# Patient Record
Sex: Female | Born: 1960 | Race: White | Hispanic: No | Smoking: Former smoker
Health system: Southern US, Community
[De-identification: ages and names within clinical notes are randomized; demographics above are authoritative.]

## PROBLEM LIST (undated history)

## (undated) ENCOUNTER — Ambulatory Visit (HOSPITAL_COMMUNITY): Admission: EM | Discharge status: Against Medical Advice | Payer: 59

## (undated) DIAGNOSIS — K8020 Calculus of gallbladder without cholecystitis without obstruction: Secondary | ICD-10-CM

## (undated) DIAGNOSIS — C541 Malignant neoplasm of endometrium: Secondary | ICD-10-CM

## (undated) DIAGNOSIS — N200 Calculus of kidney: Secondary | ICD-10-CM

## (undated) DIAGNOSIS — R569 Unspecified convulsions: Secondary | ICD-10-CM

## (undated) DIAGNOSIS — T8859XA Other complications of anesthesia, initial encounter: Secondary | ICD-10-CM

## (undated) DIAGNOSIS — T4145XA Adverse effect of unspecified anesthetic, initial encounter: Secondary | ICD-10-CM

## (undated) DIAGNOSIS — M5030 Other cervical disc degeneration, unspecified cervical region: Secondary | ICD-10-CM

## (undated) DIAGNOSIS — N83209 Unspecified ovarian cyst, unspecified side: Secondary | ICD-10-CM

## (undated) DIAGNOSIS — R011 Cardiac murmur, unspecified: Secondary | ICD-10-CM

## (undated) DIAGNOSIS — K769 Liver disease, unspecified: Secondary | ICD-10-CM

## (undated) DIAGNOSIS — K5790 Diverticulosis of intestine, part unspecified, without perforation or abscess without bleeding: Secondary | ICD-10-CM

## (undated) DIAGNOSIS — T7840XA Allergy, unspecified, initial encounter: Secondary | ICD-10-CM

## (undated) DIAGNOSIS — K621 Rectal polyp: Secondary | ICD-10-CM

## (undated) HISTORY — DX: Liver disease, unspecified: K76.9

## (undated) HISTORY — PX: CHOLECYSTECTOMY: SHX55

## (undated) HISTORY — DX: Calculus of kidney: N20.0

## (undated) HISTORY — DX: Malignant neoplasm of endometrium: C54.1

## (undated) HISTORY — DX: Calculus of gallbladder without cholecystitis without obstruction: K80.20

## (undated) HISTORY — DX: Allergy, unspecified, initial encounter: T78.40XA

## (undated) HISTORY — DX: Rectal polyp: K62.1

## (undated) HISTORY — PX: CERVICAL FUSION: SHX112

## (undated) HISTORY — PX: OVARIAN CYST REMOVAL: SHX89

## (undated) HISTORY — DX: Diverticulosis of intestine, part unspecified, without perforation or abscess without bleeding: K57.90

## (undated) HISTORY — PX: OTHER SURGICAL HISTORY: SHX169

---

## 1998-03-18 ENCOUNTER — Emergency Department (HOSPITAL_COMMUNITY): Admission: EM | Admit: 1998-03-18 | Discharge: 1998-03-18 | Payer: Self-pay | Admitting: Emergency Medicine

## 1998-03-18 ENCOUNTER — Encounter: Payer: Self-pay | Admitting: Emergency Medicine

## 1998-03-25 ENCOUNTER — Other Ambulatory Visit: Admission: RE | Admit: 1998-03-25 | Discharge: 1998-03-25 | Payer: Self-pay | Admitting: *Deleted

## 1999-06-02 ENCOUNTER — Ambulatory Visit (HOSPITAL_COMMUNITY): Admission: RE | Admit: 1999-06-02 | Discharge: 1999-06-02 | Payer: Self-pay | Admitting: Family Medicine

## 1999-08-20 ENCOUNTER — Ambulatory Visit (HOSPITAL_COMMUNITY): Admission: RE | Admit: 1999-08-20 | Discharge: 1999-08-20 | Payer: Self-pay | Admitting: *Deleted

## 1999-11-15 ENCOUNTER — Emergency Department (HOSPITAL_COMMUNITY): Admission: EM | Admit: 1999-11-15 | Discharge: 1999-11-15 | Payer: Self-pay | Admitting: Emergency Medicine

## 1999-11-15 ENCOUNTER — Encounter: Payer: Self-pay | Admitting: Emergency Medicine

## 2000-08-14 ENCOUNTER — Emergency Department (HOSPITAL_COMMUNITY): Admission: EM | Admit: 2000-08-14 | Discharge: 2000-08-15 | Payer: Self-pay

## 2000-08-28 ENCOUNTER — Emergency Department (HOSPITAL_COMMUNITY): Admission: EM | Admit: 2000-08-28 | Discharge: 2000-08-28 | Payer: Self-pay | Admitting: Emergency Medicine

## 2000-09-10 ENCOUNTER — Emergency Department (HOSPITAL_COMMUNITY): Admission: EM | Admit: 2000-09-10 | Discharge: 2000-09-10 | Payer: Self-pay | Admitting: Emergency Medicine

## 2001-09-19 ENCOUNTER — Encounter: Payer: Self-pay | Admitting: Emergency Medicine

## 2001-09-19 ENCOUNTER — Emergency Department (HOSPITAL_COMMUNITY): Admission: EM | Admit: 2001-09-19 | Discharge: 2001-09-19 | Payer: Self-pay | Admitting: Emergency Medicine

## 2001-09-20 ENCOUNTER — Encounter: Admission: RE | Admit: 2001-09-20 | Discharge: 2001-09-20 | Payer: Self-pay

## 2001-10-17 ENCOUNTER — Emergency Department (HOSPITAL_COMMUNITY): Admission: EM | Admit: 2001-10-17 | Discharge: 2001-10-17 | Payer: Self-pay | Admitting: Emergency Medicine

## 2002-04-05 ENCOUNTER — Emergency Department (HOSPITAL_COMMUNITY): Admission: EM | Admit: 2002-04-05 | Discharge: 2002-04-05 | Payer: Self-pay | Admitting: Emergency Medicine

## 2002-04-15 ENCOUNTER — Emergency Department (HOSPITAL_COMMUNITY): Admission: EM | Admit: 2002-04-15 | Discharge: 2002-04-15 | Payer: Self-pay | Admitting: Emergency Medicine

## 2002-05-07 ENCOUNTER — Emergency Department (HOSPITAL_COMMUNITY): Admission: EM | Admit: 2002-05-07 | Discharge: 2002-05-07 | Payer: Self-pay | Admitting: Emergency Medicine

## 2002-05-07 ENCOUNTER — Encounter: Payer: Self-pay | Admitting: Emergency Medicine

## 2002-05-15 ENCOUNTER — Encounter: Admission: RE | Admit: 2002-05-15 | Discharge: 2002-05-15 | Payer: Self-pay | Admitting: Internal Medicine

## 2002-08-16 ENCOUNTER — Emergency Department (HOSPITAL_COMMUNITY): Admission: EM | Admit: 2002-08-16 | Discharge: 2002-08-16 | Payer: Self-pay | Admitting: Nurse Practitioner

## 2003-05-16 ENCOUNTER — Emergency Department (HOSPITAL_COMMUNITY): Admission: EM | Admit: 2003-05-16 | Discharge: 2003-05-16 | Payer: Self-pay | Admitting: Family Medicine

## 2003-06-25 ENCOUNTER — Emergency Department (HOSPITAL_COMMUNITY): Admission: EM | Admit: 2003-06-25 | Discharge: 2003-06-25 | Payer: Self-pay | Admitting: Emergency Medicine

## 2004-06-03 ENCOUNTER — Encounter: Admission: RE | Admit: 2004-06-03 | Discharge: 2004-06-03 | Payer: Self-pay | Admitting: Obstetrics & Gynecology

## 2004-06-22 ENCOUNTER — Emergency Department (HOSPITAL_COMMUNITY): Admission: EM | Admit: 2004-06-22 | Discharge: 2004-06-22 | Payer: Self-pay | Admitting: Emergency Medicine

## 2004-06-22 ENCOUNTER — Inpatient Hospital Stay (HOSPITAL_COMMUNITY): Admission: AD | Admit: 2004-06-22 | Discharge: 2004-06-22 | Payer: Self-pay | Admitting: *Deleted

## 2005-03-23 ENCOUNTER — Emergency Department (HOSPITAL_COMMUNITY): Admission: EM | Admit: 2005-03-23 | Discharge: 2005-03-23 | Payer: Self-pay | Admitting: Family Medicine

## 2005-10-10 ENCOUNTER — Emergency Department (HOSPITAL_COMMUNITY): Admission: EM | Admit: 2005-10-10 | Discharge: 2005-10-10 | Payer: Self-pay | Admitting: Emergency Medicine

## 2006-01-19 ENCOUNTER — Emergency Department (HOSPITAL_COMMUNITY): Admission: EM | Admit: 2006-01-19 | Discharge: 2006-01-19 | Payer: Self-pay | Admitting: Family Medicine

## 2006-01-30 ENCOUNTER — Emergency Department (HOSPITAL_COMMUNITY): Admission: EM | Admit: 2006-01-30 | Discharge: 2006-01-30 | Payer: Self-pay | Admitting: Family Medicine

## 2006-02-21 ENCOUNTER — Emergency Department (HOSPITAL_COMMUNITY): Admission: EM | Admit: 2006-02-21 | Discharge: 2006-02-21 | Payer: Self-pay | Admitting: Family Medicine

## 2006-05-09 ENCOUNTER — Emergency Department (HOSPITAL_COMMUNITY): Admission: EM | Admit: 2006-05-09 | Discharge: 2006-05-09 | Payer: Self-pay | Admitting: Family Medicine

## 2006-07-17 ENCOUNTER — Emergency Department (HOSPITAL_COMMUNITY): Admission: EM | Admit: 2006-07-17 | Discharge: 2006-07-17 | Payer: Self-pay | Admitting: Emergency Medicine

## 2006-08-24 ENCOUNTER — Emergency Department (HOSPITAL_COMMUNITY): Admission: EM | Admit: 2006-08-24 | Discharge: 2006-08-24 | Payer: Self-pay | Admitting: Emergency Medicine

## 2006-09-26 ENCOUNTER — Emergency Department (HOSPITAL_COMMUNITY): Admission: EM | Admit: 2006-09-26 | Discharge: 2006-09-26 | Payer: Self-pay | Admitting: Emergency Medicine

## 2006-10-16 ENCOUNTER — Emergency Department (HOSPITAL_COMMUNITY): Admission: EM | Admit: 2006-10-16 | Discharge: 2006-10-16 | Payer: Self-pay | Admitting: Emergency Medicine

## 2006-11-10 ENCOUNTER — Emergency Department (HOSPITAL_COMMUNITY): Admission: EM | Admit: 2006-11-10 | Discharge: 2006-11-10 | Payer: Self-pay | Admitting: Family Medicine

## 2007-02-05 ENCOUNTER — Emergency Department (HOSPITAL_COMMUNITY): Admission: EM | Admit: 2007-02-05 | Discharge: 2007-02-05 | Payer: Self-pay | Admitting: Emergency Medicine

## 2007-02-26 ENCOUNTER — Emergency Department (HOSPITAL_COMMUNITY): Admission: EM | Admit: 2007-02-26 | Discharge: 2007-02-26 | Payer: Self-pay | Admitting: Emergency Medicine

## 2007-03-17 ENCOUNTER — Emergency Department (HOSPITAL_COMMUNITY): Admission: EM | Admit: 2007-03-17 | Discharge: 2007-03-17 | Payer: Self-pay | Admitting: Emergency Medicine

## 2007-04-28 ENCOUNTER — Emergency Department (HOSPITAL_COMMUNITY): Admission: EM | Admit: 2007-04-28 | Discharge: 2007-04-28 | Payer: Self-pay | Admitting: Family Medicine

## 2007-05-14 ENCOUNTER — Emergency Department (HOSPITAL_COMMUNITY): Admission: EM | Admit: 2007-05-14 | Discharge: 2007-05-14 | Payer: Self-pay | Admitting: Family Medicine

## 2007-06-05 ENCOUNTER — Emergency Department (HOSPITAL_COMMUNITY): Admission: EM | Admit: 2007-06-05 | Discharge: 2007-06-05 | Payer: Self-pay | Admitting: Emergency Medicine

## 2007-06-20 ENCOUNTER — Emergency Department (HOSPITAL_COMMUNITY): Admission: EM | Admit: 2007-06-20 | Discharge: 2007-06-20 | Payer: Self-pay | Admitting: Emergency Medicine

## 2008-04-18 ENCOUNTER — Ambulatory Visit: Payer: Self-pay | Admitting: Obstetrics and Gynecology

## 2008-04-18 ENCOUNTER — Inpatient Hospital Stay (HOSPITAL_COMMUNITY): Admission: AD | Admit: 2008-04-18 | Discharge: 2008-04-18 | Payer: Self-pay | Admitting: Obstetrics & Gynecology

## 2008-05-28 ENCOUNTER — Encounter: Payer: Self-pay | Admitting: Obstetrics and Gynecology

## 2008-05-28 ENCOUNTER — Ambulatory Visit: Payer: Self-pay | Admitting: Obstetrics and Gynecology

## 2008-05-28 ENCOUNTER — Other Ambulatory Visit: Admission: RE | Admit: 2008-05-28 | Discharge: 2008-05-28 | Payer: Self-pay | Admitting: Obstetrics and Gynecology

## 2008-05-28 LAB — CONVERTED CEMR LAB
HCT: 37.5 % (ref 36.0–46.0)
Hemoglobin: 12.6 g/dL (ref 12.0–15.0)
MCHC: 33.6 g/dL (ref 30.0–36.0)
MCV: 89.5 fL (ref 78.0–100.0)
Platelets: 264 10*3/uL (ref 150–400)
RBC: 4.19 M/uL (ref 3.87–5.11)
RDW: 13.1 % (ref 11.5–15.5)
TSH: 1.487 microintl units/mL (ref 0.350–4.500)
WBC: 4.2 10*3/uL (ref 4.0–10.5)

## 2008-06-18 ENCOUNTER — Ambulatory Visit: Payer: Self-pay | Admitting: Obstetrics and Gynecology

## 2008-10-14 ENCOUNTER — Emergency Department (HOSPITAL_COMMUNITY): Admission: EM | Admit: 2008-10-14 | Discharge: 2008-10-14 | Payer: Self-pay | Admitting: Emergency Medicine

## 2008-11-14 ENCOUNTER — Emergency Department (HOSPITAL_COMMUNITY): Admission: EM | Admit: 2008-11-14 | Discharge: 2008-11-14 | Payer: Self-pay | Admitting: Emergency Medicine

## 2008-12-14 ENCOUNTER — Emergency Department (HOSPITAL_COMMUNITY): Admission: EM | Admit: 2008-12-14 | Discharge: 2008-12-14 | Payer: Self-pay | Admitting: Family Medicine

## 2009-01-16 ENCOUNTER — Emergency Department (HOSPITAL_COMMUNITY): Admission: EM | Admit: 2009-01-16 | Discharge: 2009-01-16 | Payer: Self-pay | Admitting: Family Medicine

## 2009-02-16 ENCOUNTER — Inpatient Hospital Stay (HOSPITAL_COMMUNITY): Admission: AD | Admit: 2009-02-16 | Discharge: 2009-02-16 | Payer: Self-pay | Admitting: Obstetrics and Gynecology

## 2009-04-11 ENCOUNTER — Emergency Department (HOSPITAL_COMMUNITY): Admission: EM | Admit: 2009-04-11 | Discharge: 2009-04-11 | Payer: Self-pay | Admitting: Family Medicine

## 2009-05-04 ENCOUNTER — Emergency Department (HOSPITAL_COMMUNITY): Admission: EM | Admit: 2009-05-04 | Discharge: 2009-05-04 | Payer: Self-pay | Admitting: Emergency Medicine

## 2009-07-11 ENCOUNTER — Emergency Department (HOSPITAL_COMMUNITY): Admission: EM | Admit: 2009-07-11 | Discharge: 2009-07-11 | Payer: Self-pay | Admitting: Family Medicine

## 2009-09-11 ENCOUNTER — Ambulatory Visit: Payer: Self-pay | Admitting: Gynecology

## 2009-09-11 ENCOUNTER — Inpatient Hospital Stay (HOSPITAL_COMMUNITY): Admission: AD | Admit: 2009-09-11 | Discharge: 2009-09-11 | Payer: Self-pay | Admitting: Obstetrics & Gynecology

## 2009-10-13 ENCOUNTER — Emergency Department (HOSPITAL_COMMUNITY): Admission: EM | Admit: 2009-10-13 | Discharge: 2009-10-13 | Payer: Self-pay | Admitting: Family Medicine

## 2009-10-13 ENCOUNTER — Emergency Department (HOSPITAL_COMMUNITY): Admission: EM | Admit: 2009-10-13 | Discharge: 2009-10-13 | Payer: Self-pay | Admitting: Emergency Medicine

## 2009-10-22 ENCOUNTER — Encounter: Payer: Self-pay | Admitting: Family Medicine

## 2009-10-22 ENCOUNTER — Ambulatory Visit: Payer: Self-pay | Admitting: Obstetrics and Gynecology

## 2009-10-22 LAB — CONVERTED CEMR LAB
Clue Cells Wet Prep HPF POC: NONE SEEN
Trich, Wet Prep: NONE SEEN
Yeast Wet Prep HPF POC: NONE SEEN

## 2009-11-06 ENCOUNTER — Emergency Department (HOSPITAL_COMMUNITY): Admission: EM | Admit: 2009-11-06 | Discharge: 2009-11-06 | Payer: Self-pay | Admitting: Family Medicine

## 2009-12-01 ENCOUNTER — Emergency Department (HOSPITAL_COMMUNITY): Admission: EM | Admit: 2009-12-01 | Discharge: 2009-12-01 | Payer: Self-pay | Admitting: Emergency Medicine

## 2009-12-24 ENCOUNTER — Ambulatory Visit: Payer: Self-pay | Admitting: Obstetrics and Gynecology

## 2010-02-01 ENCOUNTER — Emergency Department (HOSPITAL_COMMUNITY)
Admission: EM | Admit: 2010-02-01 | Discharge: 2010-02-01 | Payer: Self-pay | Source: Home / Self Care | Admitting: Family Medicine

## 2010-02-03 LAB — POCT URINALYSIS DIPSTICK
Bilirubin Urine: NEGATIVE
Ketones, ur: NEGATIVE mg/dL
Nitrite: POSITIVE — AB
Protein, ur: NEGATIVE mg/dL
Specific Gravity, Urine: 1.015 (ref 1.005–1.030)
Urine Glucose, Fasting: 100 mg/dL — AB
Urobilinogen, UA: 1 mg/dL (ref 0.0–1.0)
pH: 5 (ref 5.0–8.0)

## 2010-02-03 LAB — POCT PREGNANCY, URINE: Preg Test, Ur: NEGATIVE

## 2010-02-08 ENCOUNTER — Encounter: Payer: Self-pay | Admitting: *Deleted

## 2010-03-12 ENCOUNTER — Inpatient Hospital Stay (INDEPENDENT_AMBULATORY_CARE_PROVIDER_SITE_OTHER)
Admission: RE | Admit: 2010-03-12 | Discharge: 2010-03-12 | Disposition: A | Discharge status: Home / Self Care | Payer: Self-pay | Source: Ambulatory Visit | Attending: Emergency Medicine | Admitting: Emergency Medicine

## 2010-03-12 DIAGNOSIS — J40 Bronchitis, not specified as acute or chronic: Secondary | ICD-10-CM

## 2010-03-12 DIAGNOSIS — J019 Acute sinusitis, unspecified: Secondary | ICD-10-CM

## 2010-03-12 LAB — POCT RAPID STREP A (OFFICE): Streptococcus, Group A Screen (Direct): NEGATIVE

## 2010-03-16 ENCOUNTER — Ambulatory Visit (INDEPENDENT_AMBULATORY_CARE_PROVIDER_SITE_OTHER): Payer: Self-pay

## 2010-03-16 ENCOUNTER — Inpatient Hospital Stay (INDEPENDENT_AMBULATORY_CARE_PROVIDER_SITE_OTHER)
Admission: RE | Admit: 2010-03-16 | Discharge: 2010-03-16 | Disposition: A | Discharge status: Home / Self Care | Payer: Self-pay | Source: Ambulatory Visit

## 2010-03-16 DIAGNOSIS — J019 Acute sinusitis, unspecified: Secondary | ICD-10-CM

## 2010-03-16 DIAGNOSIS — J40 Bronchitis, not specified as acute or chronic: Secondary | ICD-10-CM

## 2010-03-16 DIAGNOSIS — R112 Nausea with vomiting, unspecified: Secondary | ICD-10-CM

## 2010-03-18 ENCOUNTER — Emergency Department (HOSPITAL_COMMUNITY)
Admission: EM | Admit: 2010-03-18 | Discharge: 2010-03-18 | Discharge status: Against Medical Advice | Payer: Self-pay | Attending: Emergency Medicine | Admitting: Emergency Medicine

## 2010-03-18 DIAGNOSIS — R05 Cough: Secondary | ICD-10-CM | POA: Insufficient documentation

## 2010-03-18 DIAGNOSIS — R509 Fever, unspecified: Secondary | ICD-10-CM | POA: Insufficient documentation

## 2010-03-18 DIAGNOSIS — R059 Cough, unspecified: Secondary | ICD-10-CM | POA: Insufficient documentation

## 2010-03-20 ENCOUNTER — Emergency Department (HOSPITAL_COMMUNITY)
Admission: EM | Admit: 2010-03-20 | Discharge: 2010-03-20 | Disposition: A | Discharge status: Home / Self Care | Payer: Self-pay | Attending: Emergency Medicine | Admitting: Emergency Medicine

## 2010-03-20 ENCOUNTER — Emergency Department (HOSPITAL_COMMUNITY): Payer: Self-pay

## 2010-03-20 DIAGNOSIS — R10812 Left upper quadrant abdominal tenderness: Secondary | ICD-10-CM | POA: Insufficient documentation

## 2010-03-20 DIAGNOSIS — R059 Cough, unspecified: Secondary | ICD-10-CM | POA: Insufficient documentation

## 2010-03-20 DIAGNOSIS — J069 Acute upper respiratory infection, unspecified: Secondary | ICD-10-CM | POA: Insufficient documentation

## 2010-03-20 DIAGNOSIS — R112 Nausea with vomiting, unspecified: Secondary | ICD-10-CM | POA: Insufficient documentation

## 2010-03-20 DIAGNOSIS — R05 Cough: Secondary | ICD-10-CM | POA: Insufficient documentation

## 2010-03-20 DIAGNOSIS — R079 Chest pain, unspecified: Secondary | ICD-10-CM | POA: Insufficient documentation

## 2010-03-20 DIAGNOSIS — G40909 Epilepsy, unspecified, not intractable, without status epilepticus: Secondary | ICD-10-CM | POA: Insufficient documentation

## 2010-03-20 LAB — URINALYSIS, ROUTINE W REFLEX MICROSCOPIC
Bilirubin Urine: NEGATIVE
Glucose, UA: NEGATIVE mg/dL
Ketones, ur: NEGATIVE mg/dL
Nitrite: NEGATIVE
Protein, ur: NEGATIVE mg/dL
Specific Gravity, Urine: 1.013 (ref 1.005–1.030)
Urobilinogen, UA: 0.2 mg/dL (ref 0.0–1.0)
pH: 6.5 (ref 5.0–8.0)

## 2010-03-20 LAB — POCT I-STAT, CHEM 8
Calcium, Ion: 1.13 mmol/L (ref 1.12–1.32)
Creatinine, Ser: 0.6 mg/dL (ref 0.4–1.2)
Glucose, Bld: 147 mg/dL — ABNORMAL HIGH (ref 70–99)
Hemoglobin: 11.6 g/dL — ABNORMAL LOW (ref 12.0–15.0)
TCO2: 24 mmol/L (ref 0–100)

## 2010-03-20 LAB — CBC
HCT: 35.6 % — ABNORMAL LOW (ref 36.0–46.0)
Hemoglobin: 11.9 g/dL — ABNORMAL LOW (ref 12.0–15.0)
MCH: 30.7 pg (ref 26.0–34.0)
MCHC: 33.4 g/dL (ref 30.0–36.0)
MCV: 91.8 fL (ref 78.0–100.0)
Platelets: 239 10*3/uL (ref 150–400)
RBC: 3.88 MIL/uL (ref 3.87–5.11)
RDW: 12.4 % (ref 11.5–15.5)
WBC: 6.9 10*3/uL (ref 4.0–10.5)

## 2010-03-20 LAB — URINE MICROSCOPIC-ADD ON

## 2010-03-20 LAB — DIFFERENTIAL
Basophils Absolute: 0 10*3/uL (ref 0.0–0.1)
Lymphocytes Relative: 18 % (ref 12–46)
Lymphs Abs: 1.3 10*3/uL (ref 0.7–4.0)
Monocytes Absolute: 0.5 10*3/uL (ref 0.1–1.0)
Neutro Abs: 5.1 10*3/uL (ref 1.7–7.7)

## 2010-03-20 LAB — POCT CARDIAC MARKERS: CKMB, poc: <1 ng/mL — ABNORMAL LOW (ref 1.0–8.0)

## 2010-03-31 LAB — URINE CULTURE

## 2010-03-31 LAB — POCT URINALYSIS DIPSTICK
Glucose, UA: NEGATIVE mg/dL
Nitrite: POSITIVE — AB
Urobilinogen, UA: 0.2 mg/dL (ref 0.0–1.0)

## 2010-04-01 LAB — URINALYSIS, ROUTINE W REFLEX MICROSCOPIC
Glucose, UA: NEGATIVE mg/dL
Specific Gravity, Urine: >1.03 — ABNORMAL HIGH (ref 1.005–1.030)
pH: 5.5 (ref 5.0–8.0)

## 2010-04-01 LAB — URINE MICROSCOPIC-ADD ON

## 2010-04-01 LAB — CBC
HCT: 31.6 % — ABNORMAL LOW (ref 36.0–46.0)
MCHC: 33.9 g/dL (ref 30.0–36.0)
RDW: 15.3 % (ref 11.5–15.5)

## 2010-04-01 LAB — POCT PREGNANCY, URINE: Preg Test, Ur: NEGATIVE

## 2010-04-01 LAB — WET PREP, GENITAL
Trich, Wet Prep: NONE SEEN
Yeast Wet Prep HPF POC: NONE SEEN

## 2010-04-01 LAB — GC/CHLAMYDIA PROBE AMP, GENITAL: Chlamydia, DNA Probe: NEGATIVE

## 2010-04-04 LAB — URINALYSIS, ROUTINE W REFLEX MICROSCOPIC
Bilirubin Urine: NEGATIVE
Nitrite: NEGATIVE
Specific Gravity, Urine: 1.015 (ref 1.005–1.030)
pH: 7 (ref 5.0–8.0)

## 2010-04-04 LAB — POCT PREGNANCY, URINE: Preg Test, Ur: NEGATIVE

## 2010-04-06 LAB — POCT URINALYSIS DIP (DEVICE)
Glucose, UA: 100 mg/dL — AB
Nitrite: POSITIVE — AB

## 2010-04-19 LAB — POCT URINALYSIS DIP (DEVICE)
Bilirubin Urine: NEGATIVE
Glucose, UA: NEGATIVE mg/dL
Ketones, ur: 15 mg/dL — AB

## 2010-04-22 LAB — URINALYSIS, ROUTINE W REFLEX MICROSCOPIC
Leukocytes, UA: NEGATIVE
Nitrite: NEGATIVE
Specific Gravity, Urine: 1.019 (ref 1.005–1.030)
Urobilinogen, UA: 0.2 mg/dL (ref 0.0–1.0)
pH: 6.5 (ref 5.0–8.0)

## 2010-04-22 LAB — URINE MICROSCOPIC-ADD ON

## 2010-04-28 LAB — URINALYSIS, ROUTINE W REFLEX MICROSCOPIC
Bilirubin Urine: NEGATIVE
Ketones, ur: NEGATIVE mg/dL
Leukocytes, UA: NEGATIVE
Nitrite: NEGATIVE
Protein, ur: NEGATIVE mg/dL

## 2010-04-28 LAB — WET PREP, GENITAL

## 2010-04-28 LAB — URINE MICROSCOPIC-ADD ON

## 2010-04-28 LAB — GC/CHLAMYDIA PROBE AMP, GENITAL: Chlamydia, DNA Probe: NEGATIVE

## 2010-06-01 NOTE — Group Therapy Note (Signed)
NAMEESTHELA, BRANDNER NO.:  [id number]   MEDICAL RECORD NO.:  [id number]          PATIENT TYPE:  WOC   LOCATION:  [facility]                   FACILITY:  [facility]   PHYSICIAN:  Argentina Donovan, MD        DATE OF BIRTH:  [birth year]   DATE OF SERVICE:  05/28/2008                                  CLINIC NOTE   The patient is a 50 year old Caucasian female gravida?  para 1-0-2-1,  who was seen in the [facility]U because of abdominal pain and heavy vaginal  bleeding which has increased over the past 3 months although she has had  very irregular bleeding for the past couple of years.  It has been about  5 years since her Pap smear and since the mammogram.  She has no  significant family history of breast cancer nor uterine cancer.  She had  an ultrasound which showed normal size uterus with a question of  endometrial polyp.   EXAMINATION:  The patient's breasts are symmetrical.  No dominant  masses.  No nipple discharge.  No supraclavicular or axillary nodes.  ABDOMEN:  Soft, flat, nontender.  No masses, no organomegaly.  External genitalia is normal.  BUS is within normal limits.  Vagina is  clean, well rugated.  Cervix clean, parous and fish mouth.  The uterus  is anterior, of normal size, shape, consistency.  Adnexa is normal.  A  Pap smear was taken as well as an endometrial biopsy having sounded the  uterus to 7 cm, and a good sample was taken.   The patient will return in 2 weeks for evaluation of the examinations.  Also we are going to get a CBC and TSH on her.  She is a nonsmoker, so  hormone therapy that controls the bleeding and the discomfort is a real  possibility.   IMPRESSION:  Dysfunctional uterine bleeding with dysmenorrhea.   PAST HISTORY:  1. The patient has been for about 7 years on Dilantin for seizures.      She has had rare seizures recently.  2. 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   FAMILY HISTORY:  The patient has a family history of diabetes in her  father, an aunt with Doreatha Martin disease, an uncle with leukemia, but  once again no breast cancer, no gynecological cancer.           ______________________________  Argentina Donovan, MD     PR/MEDQ  D:  05/28/2008  T:  05/28/2008  Job:  [id number]

## 2010-06-01 NOTE — Group Therapy Note (Signed)
[name], Brandi Warren              ACCOUNT NO.:  [id number]   MEDICAL RECORD NO.:  [id number]          PATIENT TYPE:  WOC   LOCATION:  [facility]                   FACILITY:  [facility]   PHYSICIAN:  Argentina Donovan, MD        DATE OF BIRTH:  Nov 29, 1960   DATE OF SERVICE:                                  CLINIC NOTE   Patient is a 50 year old Caucasian female with dysfunctional uterine  bleeding that underwent an endometrial biopsy 2 weeks ago which showed  chronic endometritis, no evidence hyperplasia and she had a normal Pap  smear.  The bleeding has continued.  She states it is heavy; although  her hemoglobin was 12.6 so reality is not the same as the impression  that she gets.  We talked to her about this.  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 Was going to give her a 10-day  trial on doxycycline and have her come back in 2 weeks if the bleeding  has not stopped.  I told her then we would cycle her on birth control  pills if the antibiotic alone did not work, as she is not a smoker.   IMPRESSION:  Dysfunctional uterine bleeding, probable secondary to  chronic endometritis.           ______________________________  Argentina Donovan, MD     PR/MEDQ  D:  06/18/2008  T:  06/18/2008  Job:  [id number]

## 2010-06-04 NOTE — Discharge Summary (Signed)
[facility] [place]  Patient:    Brandi Warren, Brandi Warren                     MRN: [id number] Adm. Date:  [id number] Disc. Date: [id number] Attending:  Donne Hazel                           Discharge Summary  NO DICTATION DD:  08/24/99 TD:  08/24/99 Job: [id number]

## 2010-06-04 NOTE — Op Note (Signed)
[place]  Patient:    Brandi Warren, Brandi Warren                     MRN: [id number] Proc. Date: 08/20/99 Adm. Date:  [id number] Attending:  Donne Hazel                           Operative Report  PREOPERATIVE DIAGNOSIS: Chronic and acute pelvic pain.  POSTOPERATIVE DIAGNOSIS: Ovarian cyst, benign.  SURGEON: Willey Blade, M.D.  OPERATION/PROCEDURE:  1. Laparoscopy.  2. Cystectomy x 2.  3. Bilateral ovarian cystectomy.  ANESTHESIA: General endotracheal.  ESTIMATED BLOOD LOSS: Less than 20 cc.  COMPLICATIONS: None.  FINDINGS: At the time of laparoscopy the pelvis was thoroughly visualized. The sigmoid colon was adherent to the left pelvic sidewall and these adhesions were lysed to normalize the anatomy.  There were three ovarian cysts encountered, all of which were benign in appearance.  There was a 4 cm ovarian cyst on the right ovary which was simple in appearance, and also a 1 cm benign ovarian cyst of the right ovary.  The left ovary had one small 2 cm benign appearing ovarian cyst.  The tubes were visualized bilaterally and noted to be normal.  The uterus was visualized as well and noted to be normal.  The cul-de-sacs were visualized and noted to be free of disease.  No evidence of adhesions, endometriosis, or other abnormality visualized.  The appendix was visualized and noted to be normal.  The upper abdomen and bowel were also briefly visualized and noted to be normal.  DESCRIPTION OF PROCEDURE: The patient was taken to the operating room and a general endotracheal anesthetic was administered.  The patient was placed on the operative table in the dorsal lithotomy position and the abdomen, perineum, and vagina prepped and draped in the usual sterile fashion with Betadine and sterile drapes.  The bladder was emptied with a red rubber catheter.  A Hulka tenaculum was placed inside the uterine cavity for uterine manipulation.  Next,  a small transverse infraumbilical skin incision was made, through which a Verres needle was inserted atraumatically into the abdominal cavity.  A pneumoperitoneum was then created with 2.5 liters carbon dioxide gas.  The Verres needle was removed and a disposable laparoscopic trocar was inserted atraumatically into the abdominal cavity.  A laparoscope was then placed with the above noted findings.  A second incision was made two fingerbreadths above the pubic symphysis and in the midline.  Through this a 5 mm trocar was inserted under direct vision, using continuous laparoscopic guidance.  The pelvis was visualized, with the above noted findings. Attention was then turned to multiple cystectomy.  All cysts were very benign in appearance.  The cysts were cauterized thoroughly with the bipolar cautery carefully and safely.  The cyst was then entered sharply, with clear fluid drained from all three small cysts.  The pelvis was then thoroughly irrigated with copious amounts of irrigant after removal of these cysts.  Good hemostasis was noted from all the operative areas.  As mentioned, a lysis of adhesions was carried out between the sigmoid colon and the left pelvic sidewall.  This was done with blunt and sharp dissection.  The anatomy was normalized.  No complications occurred.  All instruments were removed and attention was turned to closure.  All the gas was allowed to escape from the abdomen.  The infraumbilical skin incision was closed  first with three interrupted sutures on the muscle fascia.  The skin was then reapproximated with multiple interrupted sutures of 3-0 Vicryl Rapide.  The patient was awakened, extubated, and taken to the recovery room in good condition.  There were no perioperative complications. DD:  08/20/99 TD:  08/22/99 Job: [id number]

## 2010-06-04 NOTE — Discharge Summary (Signed)
[facility] [place]  Patient:    Brandi Warren, Brandi Warren                     MRN: [id number] Adm. Date:  [id number] Disc. Date: [id number] Attending:  Donne Hazel CC:         Raul Del, M.D.             Risk Management                           Discharge Summary  DATE OF BIRTH:                Nov 21, 1960  HISTORY:                      Brandi Warren is a 50 year old female who underwent general anesthesia on August 20, 1999, at which time she underwent endotracheal intubation for a diagnostic laparoscopy with lysis of adhesions, excision of ovarian cyst by Dr. Malachy Mood.  Anesthesia was performed by Dr. Raul Del and by Dory Larsen, CRNA.  A review of the chart reveals an uneventful case and uneventful intubation and emergence.  The patient was contacted postoperatively on follow-up by the nursing staff in the PACU and was felt that she had problems with one of her molars.  She felt as though one of the fillings was potentially missing and it was giving her pain.  I telephoned the patient this morning, August 23, 1999, and spoke with her.  She is not sure that the filling is missing.  The tooth is no longer bothering her; however, she is currently on pain medications, which she plans to discontinue in the upcoming days.  I have spoken with Yancey Flemings at Risk Management and will call corporate Risk Management and refer the case to them for follow-up.  I have given the patient our office number, our OR number, and my personal number should she have any further problems.  I have told her that we will take care of the situation as needed. DD:  08/24/99 TD:  08/24/99 Job: [id number]

## 2010-06-04 NOTE — H&P (Signed)
[facility] [place]  Patient:    Brandi Warren, Brandi Warren                     MRN: [id number] Adm. Date:  [id number] Attending:  Donne Hazel                         History and Physical  HISTORY OF PRESENT ILLNESS:   Ms. Laymon is a 50 year old female gravida 1 para 1 admitted initially for laparoscopic bilateral tubal ligation and laparoscopy for pelvic pain.  The patient has had a great deal of recurrent pelvic pain recently, consistent with recurrent ovarian cyst.  I have seen her approximately four times over the last year for recurrent ovarian cysts.  Her surgery was initially scheduled for tubal ligation, and on the morning of surgery, the patient has declined tubal ligation but wishes to proceed with laparoscopy for pelvic pain and recurrent ovarian cyst.  Informed consent given regarding this procedure.  MEDICAL HISTORY:              1. History of seizure disorder.  2. History of peptic ulcer disease.  3. History of hypertension.  SURGICAL HISTORY:             1. In 1986, back surgery - unknown type. 2. Cholecystectomy in 1980s.  3. In 1995, repair of fractured right arm.  OBSTETRICAL HISTORY:          Normal spontaneous vaginal delivery x 1 at term.  CURRENT MEDICATIONS:          Dilantin, atenolol, and Valium p.r.n.  ALLERGIES:                    SULFA and PENICILLIN.  PHYSICAL EXAMINATION:  VITAL SIGNS:                  Stable, temperature 98.5, pulse 69, respirations 20, blood pressure 116/67.  GENERAL:                      She is a well-developed, well-nourished female in no acute distress.  HEENT:                        Within normal limits.  NECK:                         Supple without adenopathy or thyromegaly.  HEART:                        Regular rate and rhythm without murmur, gallop, or rub.  LUNGS:                        Clear to auscultation.  BREAST:                       Done recently in the office was normal but  is deferred upon admission.  ABDOMEN:                      Soft, benign, without masses, tenderness, hernia, or organomegaly.  EXTREMITIES AND NEUROLOGIC:   Grossly normal.  PELVIC:                       Normal external female genitalia, vagina and cervix clear.  The uterus is small, nontender, and mobile.  The adnexa mildly tender without mass.  ADMITTING DIAGNOSES:          1. Chronic and acute pelvic pain.                               2. Patient initially requested tubal ligation -                                  declined on morning of surgery.                               3. Seizure disorder.  PLAN:                         Laparoscopy.  DISCUSSION:                   The patient declined tubal ligation on morning of surgery, as mentioned.  She wished to proceed with laparoscopy for pelvic pain.  The risks, benefits, goals of therapy, and limitations of laparoscopy explained to patient at length.  The risk of bleeding, infection, risk of injury to surrounding organs, and the fact that this procedure might not help her pelvic pain discussed.  The risks and benefits of this procedure reviewed. The patient wished to proceed. DD:  08/20/99 TD:  08/20/99 Job: [id number]

## 2010-10-02 ENCOUNTER — Inpatient Hospital Stay (INDEPENDENT_AMBULATORY_CARE_PROVIDER_SITE_OTHER)
Admission: RE | Admit: 2010-10-02 | Discharge: 2010-10-02 | Disposition: A | Discharge status: Home / Self Care | Payer: Self-pay | Source: Ambulatory Visit | Attending: Emergency Medicine | Admitting: Emergency Medicine

## 2010-10-02 DIAGNOSIS — R6889 Other general symptoms and signs: Secondary | ICD-10-CM

## 2010-10-27 LAB — POCT URINALYSIS DIP (DEVICE)
Ketones, ur: NEGATIVE
Protein, ur: NEGATIVE
Specific Gravity, Urine: 1.025
pH: 6

## 2010-10-28 LAB — DIFFERENTIAL
Basophils Absolute: 0
Basophils Relative: 1
Eosinophils Relative: 0
Lymphocytes Relative: 30
Monocytes Absolute: 0.5
Monocytes Relative: 8
Neutro Abs: 3.7

## 2010-10-28 LAB — URINALYSIS, ROUTINE W REFLEX MICROSCOPIC
Bilirubin Urine: NEGATIVE
Glucose, UA: NEGATIVE
Ketones, ur: NEGATIVE
Specific Gravity, Urine: 1.029
pH: 6

## 2010-10-28 LAB — CBC
HCT: 34.6 — ABNORMAL LOW
Hemoglobin: 11.5 — ABNORMAL LOW
MCHC: 33.2
RBC: 3.9
RDW: 15.3 — ABNORMAL HIGH

## 2010-11-12 ENCOUNTER — Inpatient Hospital Stay (INDEPENDENT_AMBULATORY_CARE_PROVIDER_SITE_OTHER)
Admission: RE | Admit: 2010-11-12 | Discharge: 2010-11-12 | Disposition: A | Discharge status: Home / Self Care | Payer: Self-pay | Source: Ambulatory Visit | Attending: Family Medicine | Admitting: Family Medicine

## 2010-11-12 DIAGNOSIS — G609 Hereditary and idiopathic neuropathy, unspecified: Secondary | ICD-10-CM

## 2011-01-25 ENCOUNTER — Other Ambulatory Visit: Payer: Self-pay

## 2011-01-25 ENCOUNTER — Emergency Department (INDEPENDENT_AMBULATORY_CARE_PROVIDER_SITE_OTHER)
Admission: EM | Admit: 2011-01-25 | Discharge: 2011-01-25 | Disposition: A | Discharge status: Home / Self Care | Payer: Self-pay | Source: Home / Self Care

## 2011-01-25 DIAGNOSIS — IMO0002 Reserved for concepts with insufficient information to code with codable children: Secondary | ICD-10-CM

## 2011-01-25 DIAGNOSIS — R51 Headache: Secondary | ICD-10-CM

## 2011-01-25 DIAGNOSIS — S239XXA Sprain of unspecified parts of thorax, initial encounter: Secondary | ICD-10-CM

## 2011-01-25 DIAGNOSIS — I498 Other specified cardiac arrhythmias: Secondary | ICD-10-CM

## 2011-01-25 HISTORY — DX: Unspecified convulsions: R56.9

## 2011-01-25 MED ORDER — METHOCARBAMOL 750 MG PO TABS: 750.0000 mg | ORAL_TABLET | Freq: Four times a day (QID) | ORAL | Status: DC

## 2011-01-25 MED ORDER — MELOXICAM 7.5 MG PO TABS: 7.5000 mg | ORAL_TABLET | Freq: Two times a day (BID) | ORAL | Status: DC
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
## 2011-01-26 DIAGNOSIS — I498 Other specified cardiac arrhythmias: Secondary | ICD-10-CM | POA: Diagnosis present

## 2011-01-26 NOTE — ED Provider Notes (Signed)
Medical screening examination/treatment/procedure(s) were performed by non-physician practitioner and as supervising physician I was immediately available for consultation/collaboration.  Hillery Hunter, MD 01/26/11 2107

## 2011-01-28 NOTE — ED Notes (Signed)
PT CALLED IN REQUESTING A NEW ORTHOPEDIC REFERRAL. PER WC INJURY.PT WAS TOLD BY [place] ORTHOPEDIST SHE NEEDS TO HAVE ATTORNEY TO CALL FOR REFERRAL AND PT STATES SHE SPOKE WITH ATTORNEY AND WAS TOLD THERE UNABLE TO REFER PT TO ORTHO.SPOKE WITH DAWN SAMPSON PA AND WAS INSTRUCTED TO TELL PT TO F/U WITH PCP FRO REFERRAL.PT WAS CALLED AND VERBALIZED UNDERSTANDING 01/28/11.

## 2011-01-30 ENCOUNTER — Emergency Department (HOSPITAL_COMMUNITY): Payer: Self-pay

## 2011-01-30 ENCOUNTER — Encounter (HOSPITAL_COMMUNITY): Payer: Self-pay | Admitting: *Deleted

## 2011-01-30 ENCOUNTER — Emergency Department (HOSPITAL_COMMUNITY)
Admission: EM | Admit: 2011-01-30 | Discharge: 2011-01-31 | Disposition: A | Discharge status: Home / Self Care | Payer: Self-pay | Attending: Emergency Medicine | Admitting: Emergency Medicine

## 2011-01-30 DIAGNOSIS — R0789 Other chest pain: Secondary | ICD-10-CM

## 2011-01-30 DIAGNOSIS — S161XXA Strain of muscle, fascia and tendon at neck level, initial encounter: Secondary | ICD-10-CM

## 2011-01-30 DIAGNOSIS — R42 Dizziness and giddiness: Secondary | ICD-10-CM | POA: Insufficient documentation

## 2011-01-30 DIAGNOSIS — G40909 Epilepsy, unspecified, not intractable, without status epilepticus: Secondary | ICD-10-CM | POA: Insufficient documentation

## 2011-01-30 DIAGNOSIS — M545 Low back pain, unspecified: Secondary | ICD-10-CM | POA: Insufficient documentation

## 2011-01-30 DIAGNOSIS — S060X9A Concussion with loss of consciousness of unspecified duration, initial encounter: Secondary | ICD-10-CM

## 2011-01-30 DIAGNOSIS — S139XXA Sprain of joints and ligaments of unspecified parts of neck, initial encounter: Secondary | ICD-10-CM | POA: Insufficient documentation

## 2011-01-30 DIAGNOSIS — S39012A Strain of muscle, fascia and tendon of lower back, initial encounter: Secondary | ICD-10-CM

## 2011-01-30 DIAGNOSIS — M542 Cervicalgia: Secondary | ICD-10-CM | POA: Insufficient documentation

## 2011-01-30 DIAGNOSIS — R011 Cardiac murmur, unspecified: Secondary | ICD-10-CM | POA: Insufficient documentation

## 2011-01-30 DIAGNOSIS — R079 Chest pain, unspecified: Secondary | ICD-10-CM | POA: Insufficient documentation

## 2011-01-30 DIAGNOSIS — S335XXA Sprain of ligaments of lumbar spine, initial encounter: Secondary | ICD-10-CM | POA: Insufficient documentation

## 2011-01-30 DIAGNOSIS — R404 Transient alteration of awareness: Secondary | ICD-10-CM | POA: Insufficient documentation

## 2011-01-30 DIAGNOSIS — Z79899 Other long term (current) drug therapy: Secondary | ICD-10-CM | POA: Insufficient documentation

## 2011-01-30 DIAGNOSIS — S060X0A Concussion without loss of consciousness, initial encounter: Secondary | ICD-10-CM | POA: Insufficient documentation

## 2011-01-30 DIAGNOSIS — R071 Chest pain on breathing: Secondary | ICD-10-CM | POA: Insufficient documentation

## 2011-01-30 DIAGNOSIS — W010XXA Fall on same level from slipping, tripping and stumbling without subsequent striking against object, initial encounter: Secondary | ICD-10-CM | POA: Insufficient documentation

## 2011-01-30 DIAGNOSIS — R51 Headache: Secondary | ICD-10-CM | POA: Insufficient documentation

## 2011-01-30 NOTE — ED Notes (Signed)
Dr. Zackowski at bedside  
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
## 2011-01-30 NOTE — ED Notes (Signed)
Asked patient to change into gown.  
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
## 2011-01-30 NOTE — ED Notes (Signed)
Patient currently sitting up in bed; no respiratory or acute distress noted.  Patient updated on plan of care; informed patient that EDP is currently in with a critical patient and will be out to assess patient.  Patient understanding; has no other questions or concerns at this time.  Will continue to monitor.

## 2011-01-31 ENCOUNTER — Emergency Department (HOSPITAL_COMMUNITY): Payer: Self-pay

## 2011-01-31 MED ORDER — NAPROXEN 500 MG PO TABS: 500.0000 mg | ORAL_TABLET | Freq: Two times a day (BID) | ORAL | Status: DC

## 2011-01-31 MED ORDER — HYDROCODONE-ACETAMINOPHEN 5-325 MG PO TABS: 1.0000 | ORAL_TABLET | Freq: Four times a day (QID) | ORAL | Status: AC | PRN

## 2011-01-31 NOTE — ED Notes (Signed)
Patient currently sitting up in bed; no respiratory or acute distress noted.  Patient updated on plan of care; informed patient that we are currently waiting on x-ray to transport patient to x-ray.  Patient has no other questions or concerns at this time; will continue to monitor.

## 2011-01-31 NOTE — ED Provider Notes (Signed)
History     CSN: [id number]  Arrival date & time 01/30/11  2018   First MD Initiated Contact with Patient 01/30/11 2309      Chief Complaint  Patient presents with  . Fall    (Consider location/radiation/quality/duration/timing/severity/associated sxs/prior treatment) Patient is a 51 y.o. female presenting with fall. The history is provided by the patient.  Fall The accident occurred more than 1 week ago. The fall occurred while walking. She fell from a height of 3 to 5 ft. The point of impact was the head and neck (Back). The pain is present in the head (Neck low back right anterior chest). The pain is at a severity of 8/10. The pain is moderate. Associated symptoms include headaches and loss of consciousness. Pertinent negatives include no visual change, no fever, no numbness, no abdominal pain, no nausea, no vomiting, no hematuria, no hearing loss and no tingling.   Patient status post a work related fall on 12/27/2010 patient was knocked over striking head neck landed on the right-sided chest and injuring low back. Symptoms have not improved.  Past Medical History  Diagnosis Date  . Seizures     Past Surgical History  Procedure Date  . Cervical fusion     History reviewed. No pertinent family history.  History  Substance Use Topics  . Smoking status: Never Smoker   . Smokeless tobacco: Not on file  . Alcohol Use: No    OB History    Grav Para Term Preterm Abortions TAB SAB Ect Mult Living                  Review of Systems  Constitutional: Negative for fever.  HENT: Positive for neck pain and neck stiffness. Negative for nosebleeds, congestion and tinnitus.   Eyes: Negative for pain.  Respiratory: Negative for shortness of breath.   Cardiovascular: Positive for chest pain.  Gastrointestinal: Negative for nausea, vomiting and abdominal pain.  Genitourinary: Negative for dysuria and hematuria.  Musculoskeletal: Positive for back pain.  Skin: Negative for rash.    Neurological: Positive for dizziness, loss of consciousness and headaches. Negative for tingling, facial asymmetry, speech difficulty, weakness and numbness.  Hematological: Does not bruise/bleed easily.    Allergies  Penicillins and Sulfa antibiotics  Home Medications   Current Outpatient Rx  Name Route Sig Dispense Refill  . IBUPROFEN 200 MG PO TABS Oral Take 400 mg by mouth every 6 (six) hours as needed. For pain    . MELOXICAM 7.5 MG PO TABS Oral Take 7.5 mg by mouth 2 (two) times daily.    Marland Kitchen METHOCARBAMOL 750 MG PO TABS Oral Take 750 mg by mouth 4 (four) times daily.    Marland Kitchen PHENYTOIN SODIUM EXTENDED 100 MG PO CAPS Oral Take 200 mg by mouth daily.    Marland Kitchen HYDROCODONE-ACETAMINOPHEN 5-325 MG PO TABS Oral Take 1-2 tablets by mouth every 6 (six) hours as needed for pain. 10 tablet 0  . NAPROXEN 500 MG PO TABS Oral Take 1 tablet (500 mg total) by mouth 2 (two) times daily. 14 tablet 0    BP 101/71  Pulse 76  Temp(Src) 97.9 F (36.6 C) (Oral)  Resp 16  SpO2 98%  LMP 01/15/2011  Physical Exam  Nursing note and vitals reviewed. Constitutional: She is oriented to person, place, and time. She appears well-developed and well-nourished. No distress.  HENT:  Head: Normocephalic and atraumatic.  Mouth/Throat: Oropharynx is clear and moist.  Eyes: Conjunctivae and EOM are normal. Pupils are equal,  round, and reactive to light.  Neck: Normal range of motion. Neck supple.       Mild paraspinous neck tenderness  Cardiovascular: Normal rate, regular rhythm and intact distal pulses.   Murmur heard. Pulmonary/Chest: Effort normal and breath sounds normal. No respiratory distress. She exhibits no tenderness.  Abdominal: Soft. Bowel sounds are normal. There is no tenderness.  Musculoskeletal: Normal range of motion. She exhibits no edema and no tenderness.  Neurological: She is alert and oriented to person, place, and time. No cranial nerve deficit. She exhibits normal muscle tone. Coordination  normal.  Skin: Skin is warm. No rash noted.    ED Course  Procedures (including critical care time)  Labs Reviewed - No data to display Dg Ribs Unilateral W/chest Left  01/31/2011  *RADIOLOGY REPORT*  Clinical Data: Recent fall, persistent pain  LEFT RIBS AND CHEST - 3+ VIEW  Comparison: 03/20/2010  Findings: Postop changes at the T1 level.  Normal heart size and vascularity.  No focal pneumonia, edema, collapse, consolidation, effusion or pneumothorax.  Previous cholecystectomy.  Left rib views demonstrate no acute displaced rib fracture or focal rib abnormality.  IMPRESSION: No acute finding by plain radiography.  Postop changes.  Original Report Authenticated By: Judie Petit. Ruel Favors, M.D.   Dg Lumbar Spine Complete  01/31/2011  *RADIOLOGY REPORT*  Clinical Data: Recent fall, back pain  LUMBAR SPINE - COMPLETE 4+ VIEW  Comparison: None.  Findings: Straightened cervical spine alignment.  No fracture, compression deformity or focal kyphosis.  Very minor endplate bony spurring.  Intact pedicles.  No pars defects.  Postoperative findings from prior cholecystectomy.  Normal SI joints.  IMPRESSION: Mild degenerative changes.  No acute osseous finding.  Original Report Authenticated By: Judie Petit. Ruel Favors, M.D.   Ct Head Wo Contrast  01/31/2011  *RADIOLOGY REPORT*  Clinical Data:  Recent fall, head trauma, headaches  CT HEAD WITHOUT CONTRAST  Technique:  Contiguous axial images were obtained from the base of the skull through the vertex without contrast  Comparison:  None.  Findings:  The brain has a normal appearance without evidence for hemorrhage, acute infarction, hydrocephalus, or mass lesion.  There is no extra axial fluid collection.  The skull and paranasal sinuses are normal.  IMPRESSION: Normal CT of the head without contrast.  CT CERVICAL SPINE WITHOUT CONTRAST  Technique:  Multidetector CT imaging of the cervical spine was performed without intravenous contrast.  Multiplanar CT image reconstructions  were also generated.  Comparison:  06/05/2007 cervical spine exam by plain radiography  Findings:  Postop changes from prior T1 decompression and posterior fusion.  Cerclage wires noted posteriorly.  Chronic deformity with the bony remodeling of the T1 vertebral body.  Alignment remains anatomic.  No acute fracture demonstrated or compression deformity. No large epidural hematoma demonstrated.  No soft tissue asymmetry in the neck.  IMPRESSION: Lower cervical posterior fusion and decompression  T1.  Chronic T1 compression deformity with bony remodeling.  No acute fracture demonstrated.  Original Report Authenticated By: Judie Petit. Ruel Favors, M.D.   Ct Cervical Spine Wo Contrast  01/31/2011  *RADIOLOGY REPORT*  Clinical Data:  Recent fall, head trauma, headaches  CT HEAD WITHOUT CONTRAST  Technique:  Contiguous axial images were obtained from the base of the skull through the vertex without contrast  Comparison:  None.  Findings:  The brain has a normal appearance without evidence for hemorrhage, acute infarction, hydrocephalus, or mass lesion.  There is no extra axial fluid collection.  The skull and paranasal sinuses are  normal.  IMPRESSION: Normal CT of the head without contrast.  CT CERVICAL SPINE WITHOUT CONTRAST  Technique:  Multidetector CT imaging of the cervical spine was performed without intravenous contrast.  Multiplanar CT image reconstructions were also generated.  Comparison:  06/05/2007 cervical spine exam by plain radiography  Findings:  Postop changes from prior T1 decompression and posterior fusion.  Cerclage wires noted posteriorly.  Chronic deformity with the bony remodeling of the T1 vertebral body.  Alignment remains anatomic.  No acute fracture demonstrated or compression deformity. No large epidural hematoma demonstrated.  No soft tissue asymmetry in the neck.  IMPRESSION: Lower cervical posterior fusion and decompression  T1.  Chronic T1 compression deformity with bony remodeling.  No acute  fracture demonstrated.  Original Report Authenticated By: Judie Petit. Ruel Favors, M.D.     1. Head injury, closed, with concussion   2. Cervical strain   3. Chest wall pain   4. Lumbar strain       MDM   Status post work related injury on 12/27/2010 patient was knocked over at work striking her head since that time she been having trouble with some head pain dizziness also right-sided chest pain neck pain and lumbar back pain. Patient was like oh from work at the end of December she has not seen an injury lawyer and has not seen orthopedics or neurology yet. Patient thought symptoms would improve. No focal neurological deficits.   Workup in the emergency apartment with negative head CT CT of the neck shows only old abnormalities no acute abnormalities lumbar x-ray negative chest x-ray with rib series negative.  Patient no acute distress will discharge him anti-inflammatory and pain medicine referral to orthopedics and [place] the neurology and also recommend patient to seek a lawyer.        Shelda Jakes, MD 01/31/11 [phone]

## 2011-01-31 NOTE — ED Notes (Signed)
Patient currently sitting up in bed; changed back into clothes.  Patient updated on plan of care; informed patient that we are currently waiting on paperwork from EDP. Patient has no other questions or concerns at this time; will continue to monitor.

## 2011-01-31 NOTE — ED Notes (Addendum)
Patient given discharge paperwork; went over discharge instructions with patient. Instructed patient to take prescriptions as directed and to follow up with the primary care physician, neurologist, and orthopedist.  Instructed to return to the ED for new, worsening, or concerning symptoms.

## 2011-01-31 NOTE — ED Notes (Signed)
Patient currently sitting up in bed; no respiratory or acute distress noted.  Patient updated on plan of care; informed patient that we are waiting for the EDP to review x-ray scans.  Patient has no other questions or concerns at this time; will continue to monitor.

## 2011-05-25 ENCOUNTER — Emergency Department (INDEPENDENT_AMBULATORY_CARE_PROVIDER_SITE_OTHER)
Admission: EM | Admit: 2011-05-25 | Discharge: 2011-05-25 | Disposition: A | Discharge status: Home / Self Care | Payer: Worker's Compensation | Source: Home / Self Care | Attending: Family Medicine | Admitting: Family Medicine

## 2011-05-25 ENCOUNTER — Encounter (HOSPITAL_COMMUNITY): Payer: Self-pay | Admitting: *Deleted

## 2011-05-25 DIAGNOSIS — M778 Other enthesopathies, not elsewhere classified: Secondary | ICD-10-CM

## 2011-05-25 DIAGNOSIS — M65849 Other synovitis and tenosynovitis, unspecified hand: Secondary | ICD-10-CM

## 2011-05-25 DIAGNOSIS — M65839 Other synovitis and tenosynovitis, unspecified forearm: Secondary | ICD-10-CM

## 2011-05-25 MED ORDER — DICLOFENAC SODIUM 1 % TD GEL: 1.0000 "application " | Freq: Four times a day (QID) | TRANSDERMAL | Status: DC

## 2011-05-25 NOTE — Discharge Instructions (Signed)
Use heat, the brace and medicine 4 times a day as prescribed, see your doctors as planned.

## 2011-05-25 NOTE — ED Provider Notes (Signed)
History     CSN: [id number]  Arrival date & time 05/25/11  1657   First MD Initiated Contact with Patient 05/25/11 1710      Chief Complaint  Patient presents with  . Dizziness    (Consider location/radiation/quality/duration/timing/severity/associated sxs/prior treatment) Patient is a 51 y.o. female presenting with arm injury. The history is provided by the patient.  Arm Injury  The incident occurred today. The incident occurred at home. Injury mechanism: seen by ortho yest for mult orthop and pain issues, is being referred to [facility] where she has had surg, concerned about new sts in right forarm,    Past Medical History  Diagnosis Date  . Seizures     Past Surgical History  Procedure Date  . Cervical fusion     History reviewed. No pertinent family history.  History  Substance Use Topics  . Smoking status: Never Smoker   . Smokeless tobacco: Not on file  . Alcohol Use: No    OB History    Grav Para Term Preterm Abortions TAB SAB Ect Mult Living                  Review of Systems  Constitutional: Negative.   Musculoskeletal: Negative.     Allergies  Penicillins and Sulfa antibiotics  Home Medications   Current Outpatient Rx  Name Route Sig Dispense Refill  . DICLOFENAC SODIUM 1 % TD GEL Topical Apply 1 application topically 4 (four) times daily. 1 Tube 3  . IBUPROFEN 200 MG PO TABS Oral Take 400 mg by mouth every 6 (six) hours as needed. For pain    . MELOXICAM 7.5 MG PO TABS Oral Take 7.5 mg by mouth 2 (two) times daily.    Marland Kitchen NAPROXEN 500 MG PO TABS Oral Take 1 tablet (500 mg total) by mouth 2 (two) times daily. 14 tablet 0  . PHENYTOIN SODIUM EXTENDED 100 MG PO CAPS Oral Take 200 mg by mouth daily.      BP 128/82  Pulse 70  Temp(Src) 98.1 F (36.7 C) (Oral)  Resp 18  SpO2 100%  LMP 05/25/2011  Physical Exam  Nursing note and vitals reviewed. Constitutional: She is oriented to person, place, and time. She appears well-developed and  well-nourished.  Musculoskeletal: Normal range of motion. She exhibits tenderness.       Forearm scar with linear thickening along arm, nvt intact.  Neurological: She is alert and oriented to person, place, and time.  Skin: Skin is warm and dry.    ED Course  Procedures (including critical care time)  Labs Reviewed - No data to display No results found.   1. Forearm tendonitis       MDM          Linna Hoff, MD 05/25/11 2101

## 2011-05-25 NOTE — ED Notes (Signed)
Pt  Reports  Dizzy       Light  Headed  Tingling  Sensation     Of  Pins  And  Needles  In  r  Arm t   Reports      She  Had  An  Injury  About  6  Months  Ago           And     Is  Being  Treated     For  Same          With  Pain  meds     And  Pending evaul  At  [facility]      She  States

## 2011-07-02 ENCOUNTER — Encounter (HOSPITAL_COMMUNITY): Payer: Self-pay | Admitting: Emergency Medicine

## 2011-07-02 ENCOUNTER — Emergency Department (HOSPITAL_COMMUNITY)
Admission: EM | Admit: 2011-07-02 | Discharge: 2011-07-02 | Disposition: A | Discharge status: Home Health | Payer: Self-pay | Attending: Emergency Medicine | Admitting: Emergency Medicine

## 2011-07-02 DIAGNOSIS — N3090 Cystitis, unspecified without hematuria: Secondary | ICD-10-CM | POA: Insufficient documentation

## 2011-07-02 LAB — URINALYSIS, ROUTINE W REFLEX MICROSCOPIC
Glucose, UA: NEGATIVE mg/dL
Hgb urine dipstick: NEGATIVE
Specific Gravity, Urine: 1.015 (ref 1.005–1.030)
Urobilinogen, UA: 0.2 mg/dL (ref 0.0–1.0)

## 2011-07-02 LAB — URINE MICROSCOPIC-ADD ON

## 2011-07-02 MED ORDER — NITROFURANTOIN MONOHYD MACRO 100 MG PO CAPS: 100.0000 mg | ORAL_CAPSULE | Freq: Two times a day (BID) | ORAL | Status: AC

## 2011-07-02 MED ORDER — PHENAZOPYRIDINE HCL 200 MG PO TABS: 200.0000 mg | ORAL_TABLET | Freq: Three times a day (TID) | ORAL | Status: AC

## 2011-07-02 NOTE — ED Notes (Signed)
Onset of burning, frequency, urgency, feeling of inability to empty bladder completely when voiding. Denies fever, n/v/d,

## 2011-07-02 NOTE — ED Notes (Signed)
Discharge instructions reviewed w/ pt., verbalizes understanding. Two prescriptions provided at discharge. Encouraged to drink plenty of water.

## 2011-07-02 NOTE — Discharge Instructions (Signed)
Please read and follow all provided instructions.  Your diagnoses today include:  1. Cystitis     Tests performed today include:  Urine test - shows possible infection  Vital signs. See below for your results today.   Medications prescribed:   Macrobid - antibiotic for urinary tract infection  You have been prescribed an antibiotic medicine: take the entire course of medicine even if you are feeling better. Stopping early can cause the antibiotic not to work.  Take any prescribed medications only as directed.  Home care instructions:  Follow any educational materials contained in this packet.  Follow-up instructions: Please follow-up with your primary care provider in the next 3 days for further evaluation of your symptoms. If you do not have a primary care doctor -- see below for referral information.   Return instructions:   Please return to the Emergency Department if you experience worsening symptoms.   Return with worsening pain, vomiting, or high fever.   Please return if you have any other emergent concerns.  Additional Information:  Your vital signs today were: BP 101/71  Pulse 74  Temp 97.8 F (36.6 C)  SpO2 99%  LMP 06/20/2011 If your blood pressure (BP) was elevated above 135/85 this visit, please have this repeated by your doctor within one month. -------------- No Primary Care Doctor Call [facility]  [phone] Other agencies that provide inexpensive medical care    [facility]  [phone]    [facility]  [phone]    [facility]  [phone]    [facility]  [phone]    [facility]  [phone]    [facility]  [phone] -------------- RESOURCE GUIDE:  Dental Problems  Patients with Medicaid: [facility] [phone] [address]                                            [phone] [address] Phone:  [phone]                                                    Phone:  [phone]  If unable to pay or uninsured, contact:  [facility] or [facility]. to become qualified for the adult dental clinic.  Chronic Pain Problems Contact [facility]  [phone] Patients need to be referred by their primary care doctor.  Insufficient Money for Medicine Contact [employer]:  call "211" or [facility] [phone].  Psychological Services [facility]  [phone] [facility]  [phone] [facility]   [phone] (emergency services [phone])  Substance Abuse Resources [facility]  [phone] [facility] [phone] [facility] [phone] Floydene Flock [phone] Residential & Outpatient Substance Abuse Program  [phone]  Abuse/Neglect [facility] Child Abuse Hotline [phone] [facility] Child Abuse Hotline 7436367866 (After Hours)  Emergency Shelter [facility] [phone]  Maternity Homes [facility] [place] [facility] [place] [phone] [place] [facility] [phone]  [facility]  [place] Dept. [address]      [place] Phone:  [phone]                                   Phone:  [phone]                 Phone:  [place] Phone:  [place] [phone] (After Hours)

## 2011-07-02 NOTE — ED Notes (Signed)
Patient called requesting change in RX due to Macrobid too expensive. Rhea Bleacher PA notified, order given for Cipro 500 mg BID x three days, QS, no refills. RX called to [facility] [phone].

## 2011-07-02 NOTE — ED Provider Notes (Signed)
History     CSN: [id number]  Arrival date & time 07/02/11  4098   First MD Initiated Contact with Patient 07/02/11 [phone]      Chief Complaint  Patient presents with  . Urinary Frequency    (Consider location/radiation/quality/duration/timing/severity/associated sxs/prior treatment) HPI Comments: Patient presents with complaints of urinary tract infection including dysuria , increased frequency, increased urgency. She has had similar symptoms with previous UTIs. Patient has had nausea but no vomiting. She denies fever. She states that she has mild suprapubic tenderness. No treatments prior to arrival. Pain does not radiate. Palpation makes the pain worse. Nothing makes it better. Patient is concerned that one of her medications may be causing her symptoms.   Patient is a 51 y.o. female presenting with dysuria. The history is provided by the patient.  Dysuria  This is a new problem. The current episode started 2 days ago. The problem occurs every urination. The problem has not changed since onset.The quality of the pain is described as burning. The pain is mild. There has been no fever. Associated symptoms include nausea, frequency and urgency. Pertinent negatives include no vomiting, no discharge, no hematuria, no hesitancy and no flank pain. She has tried nothing for the symptoms. Her past medical history is significant for recurrent UTIs.    Past Medical History  Diagnosis Date  . Seizures     Past Surgical History  Procedure Date  . Cervical fusion   . Cholecystectomy   . Right arm orif     No family history on file.  History  Substance Use Topics  . Smoking status: Never Smoker   . Smokeless tobacco: Never Used  . Alcohol Use: No    OB History    Grav Para Term Preterm Abortions TAB SAB Ect Mult Living                  Review of Systems  Constitutional: Negative for fever.  HENT: Negative for sore throat and rhinorrhea.   Eyes: Negative for redness.    Respiratory: Negative for cough.   Cardiovascular: Negative for chest pain.  Gastrointestinal: Positive for nausea and abdominal pain (suprapubic tenderness, mild). Negative for vomiting and diarrhea.  Genitourinary: Positive for dysuria, urgency and frequency. Negative for hesitancy, hematuria and flank pain.  Musculoskeletal: Negative for myalgias.  Skin: Negative for rash.  Neurological: Negative for headaches.    Allergies  Penicillins and Sulfa antibiotics  Home Medications   Current Outpatient Rx  Name Route Sig Dispense Refill  . DICLOFENAC SODIUM 1 % TD GEL Topical Apply 1 application topically 4 (four) times daily. 1 Tube 3  . IBUPROFEN 200 MG PO TABS Oral Take 400 mg by mouth every 6 (six) hours as needed. For pain    . MELOXICAM 7.5 MG PO TABS Oral Take 7.5 mg by mouth 2 (two) times daily.    Marland Kitchen NAPROXEN 500 MG PO TABS Oral Take 1 tablet (500 mg total) by mouth 2 (two) times daily. 14 tablet 0  . PHENYTOIN SODIUM EXTENDED 100 MG PO CAPS Oral Take 200 mg by mouth daily.      BP 101/71  Pulse 74  Temp 97.8 F (36.6 C)  SpO2 99%  LMP 06/20/2011  Physical Exam  Nursing note and vitals reviewed. Constitutional: She appears well-developed and well-nourished.  HENT:  Head: Normocephalic and atraumatic.  Eyes: Conjunctivae are normal. Right eye exhibits no discharge. Left eye exhibits no discharge.  Neck: Normal range of motion. Neck supple.  Cardiovascular:  Normal rate, regular rhythm and normal heart sounds.   Pulmonary/Chest: Effort normal and breath sounds normal.  Abdominal: Soft. There is tenderness (mild) in the suprapubic area. There is no rigidity, no rebound, no guarding, no CVA tenderness, no tenderness at McBurney's point and negative Murphy's sign.  Neurological: She is alert.  Skin: Skin is warm and dry.  Psychiatric: She has a normal mood and affect.    ED Course  Procedures (including critical care time)  Labs Reviewed  URINALYSIS, ROUTINE W REFLEX  MICROSCOPIC - Abnormal; Notable for the following:    Bilirubin Urine SMALL (*)     Leukocytes, UA SMALL (*)     All other components within normal limits  URINE MICROSCOPIC-ADD ON - Abnormal; Notable for the following:    Squamous Epithelial / LPF FEW (*)     All other components within normal limits   No results found.   1. Cystitis     8:51 AM Patient seen and examined. UA shows likely UTI with 11-20 WBC. Pt informed. Will treat. Urged f/u with PCP if no improvement in 3 days, return if worsening. Pt verbalizes understanding and agrees with plan.   Vital signs reviewed and are as follows: Filed Vitals:   07/02/11 0756  BP: 101/71  Pulse: 74  Temp: 97.8 F (36.6 C)    MDM  Lower UTI symptoms, UA supports UTI. Will treat. No evidence of pyelo. Patient appears well, non-toxic.         Renne Crigler, [place] 07/02/11 [phone]

## 2011-07-03 NOTE — ED Provider Notes (Signed)
Medical screening examination/treatment/procedure(s) were performed by non-physician practitioner and as supervising physician I was immediately available for consultation/collaboration.   Emmanuel Gruenhagen E Jaynie Hitch, MD 07/03/11 0755 

## 2011-07-23 ENCOUNTER — Encounter (HOSPITAL_COMMUNITY): Payer: Self-pay

## 2011-07-23 ENCOUNTER — Emergency Department (INDEPENDENT_AMBULATORY_CARE_PROVIDER_SITE_OTHER)
Admission: EM | Admit: 2011-07-23 | Discharge: 2011-07-23 | Disposition: A | Discharge status: Home / Self Care | Payer: Self-pay | Source: Home / Self Care | Attending: Emergency Medicine | Admitting: Emergency Medicine

## 2011-07-23 DIAGNOSIS — K0889 Other specified disorders of teeth and supporting structures: Secondary | ICD-10-CM

## 2011-07-23 DIAGNOSIS — K089 Disorder of teeth and supporting structures, unspecified: Secondary | ICD-10-CM

## 2011-07-23 HISTORY — DX: Other cervical disc degeneration, unspecified cervical region: M50.30

## 2011-07-23 MED ORDER — MELOXICAM 15 MG PO TABS: 15.0000 mg | ORAL_TABLET | Freq: Every day | ORAL | Status: DC

## 2011-07-23 MED ORDER — LIDOCAINE-EPINEPHRINE 2 %-1:100000 IJ SOLN: 3.0000 mL | Freq: Once | INTRAMUSCULAR | Status: DC

## 2011-07-23 MED ORDER — PENICILLIN V POTASSIUM 500 MG PO TABS: 500.0000 mg | ORAL_TABLET | Freq: Four times a day (QID) | ORAL | Status: AC

## 2011-07-23 MED ORDER — LIDOCAINE VISCOUS 2 % MT SOLN: 10.0000 mL | OROMUCOSAL | Status: AC | PRN

## 2011-07-23 MED ORDER — CHLORHEXIDINE GLUCONATE 0.12 % MT SOLN: OROMUCOSAL | Status: AC

## 2011-07-23 NOTE — ED Notes (Signed)
Pt has left sided lower toothache for several weeks. She is taking hydrocodone for a neck injury.

## 2011-07-23 NOTE — ED Provider Notes (Signed)
History     CSN: [id number]  Arrival date & time 07/23/11  1103   First MD Initiated Contact with Patient 07/23/11 1110      Chief Complaint  Patient presents with  . Dental Pain    (Consider location/radiation/quality/duration/timing/severity/associated sxs/prior treatment) HPI Comments: Patient states she broke of feeling in the right lower posterior tooth about a year ago, and reports intermittent dull, throbbing, and achy pain, sensitivity to temperature. States is "getting worse" over the past several days. Reports some facial swelling. No voice changes, drooling, swelling underneath her neck, fevers, neck pain she's been taking an unknown dental pain medication without relief. She is on Norco for chronic pain, and states that this helps somewhat. She has not tried any NSAIDs. She plans to go [facility] have this [facility].  ROS as noted in HPI. All other ROS negative.   Patient is a 51 y.o. female presenting with tooth pain. The history is provided by the patient. No language interpreter was used.  Dental PainThe primary symptoms include mouth pain. The symptoms began 5 to 7 days ago. The symptoms are worsening. The symptoms are recurrent. The symptoms occur constantly.  Additional symptoms include: dental sensitivity to temperature, gum swelling, gum tenderness and facial swelling. Additional symptoms do not include: purulent gums, trismus and swollen glands. Medical issues include: periodontal disease. Medical issues do not include: alcohol problem, smoking and cancer.    Past Medical History  Diagnosis Date  . Seizures   . DDD (degenerative disc disease), cervical     Past Surgical History  Procedure Date  . Cervical fusion   . Cholecystectomy   . Right arm orif     History reviewed. No pertinent family history.  History  Substance Use Topics  . Smoking status: Never Smoker   . Smokeless tobacco: Never Used  . Alcohol Use: No    OB History    Grav  Para Term Preterm Abortions TAB SAB Ect Mult Living                  Review of Systems  HENT: Positive for facial swelling.     Allergies  Penicillins and Sulfa antibiotics  Home Medications   Current Outpatient Rx  Name Route Sig Dispense Refill  . GABAPENTIN 300 MG PO CAPS Oral Take 300 mg by mouth 3 (three) times daily.    Marland Kitchen PRESCRIPTION MEDICATION  edocol    . CHLORHEXIDINE GLUCONATE 0.12 % MT SOLN  15 mL swish and spit bid 120 mL 0  . HYDROCODONE-ACETAMINOPHEN 5-325 MG PO TABS Oral Take 1 tablet by mouth every 6 (six) hours as needed.    . IBUPROFEN 200 MG PO TABS Oral Take 400 mg by mouth every 6 (six) hours as needed. For pain    . LIDOCAINE VISCOUS 2 % MT SOLN Oral Take 10 mLs by mouth as needed for pain. Hold in mouth and spit. Do not swallow. 100 mL 0  . MELOXICAM 15 MG PO TABS Oral Take 1 tablet (15 mg total) by mouth daily. 14 tablet 0  . METHOCARBAMOL 500 MG PO TABS Oral Take 500 mg by mouth 4 (four) times daily.    Marland Kitchen PENICILLIN V POTASSIUM 500 MG PO TABS Oral Take 1 tablet (500 mg total) by mouth 4 (four) times daily. X 10 days 40 tablet 0  . PHENYTOIN SODIUM EXTENDED 100 MG PO CAPS Oral Take 200 mg by mouth daily.      BP 107/71  Pulse  82  Temp 98.7 F (37.1 C) (Oral)  Resp 18  SpO2 96%  LMP 07/01/2011  Physical Exam  Nursing note and vitals reviewed. Constitutional: She is oriented to person, place, and time. She appears well-developed and well-nourished. She appears distressed.       Appears uncomfortable  HENT:  Head: Normocephalic and atraumatic. No trismus in the jaw.  Mouth/Throat: Uvula is midline, oropharynx is clear and moist and mucous membranes are normal. Abnormal dentition. Dental caries present. No dental abscesses.         Tooth, gingiva TTP  Eyes: Conjunctivae and EOM are normal.  Neck: Normal range of motion.  Cardiovascular: Normal rate.   Pulmonary/Chest: Effort normal.  Abdominal: She exhibits no distension.  Musculoskeletal:  Normal range of motion.  Neurological: She is alert and oriented to person, place, and time. Coordination normal.  Skin: Skin is warm and dry.  Psychiatric: She has a normal mood and affect. Her behavior is normal. Judgment and thought content normal.    ED Course  Dental Date/Time: 07/23/2011 11:51 AM Performed by: Luiz Blare Authorized by: Luiz Blare Consent: Verbal consent obtained. Risks and benefits: risks, benefits and alternatives were discussed Consent given by: patient Patient understanding: patient states understanding of the procedure being performed Patient consent: the patient's understanding of the procedure matches consent given Required items: required blood products, implants, devices, and special equipment available Patient identity confirmed: verbally with patient Time out: Immediately prior to procedure a "time out" was called to verify the correct patient, procedure, equipment, support staff and site/side marked as required. Local anesthesia used: yes Local anesthetic: lidocaine 2% with epinephrine Anesthetic total: 3 ml Patient sedated: no Patient tolerance: Patient tolerated the procedure well with no immediate complications. Comments: Pain improved   (including critical care time)  Labs Reviewed - No data to display No results found.   1. Pain, dental      MDM  Previous records reviewed. Additional medical history obtained. Referring to Dr. Burgess Estelle, dentist on call. Historical penicillin allergy noted. Patient states that her mother told her that she was "allergic to it" when she was a child but does not remember what the reaction is. She is willing to try penicillin, because she doesn't think that she would be able to afford clindamycin. Instructed her to take Benadryl, Pepcid, and go immediately to the emergency room if she has any signs of anaphylaxis.  Also Peridex, will restart her on some Mobic. She is to use the Norco as  prescribed.   Luiz Blare, MD 07/23/11 1235

## 2011-09-05 ENCOUNTER — Ambulatory Visit (INDEPENDENT_AMBULATORY_CARE_PROVIDER_SITE_OTHER): Payer: Self-pay | Admitting: Family Medicine

## 2011-09-05 ENCOUNTER — Encounter: Payer: Self-pay | Admitting: Family Medicine

## 2011-09-05 VITALS — BP 129/82 | HR 85 | Ht 65.0 in | Wt 136.0 lb

## 2011-09-05 DIAGNOSIS — G40909 Epilepsy, unspecified, not intractable, without status epilepticus: Secondary | ICD-10-CM

## 2011-09-05 DIAGNOSIS — G609 Hereditary and idiopathic neuropathy, unspecified: Secondary | ICD-10-CM

## 2011-09-05 DIAGNOSIS — M542 Cervicalgia: Secondary | ICD-10-CM

## 2011-09-05 DIAGNOSIS — G8929 Other chronic pain: Secondary | ICD-10-CM

## 2011-09-05 DIAGNOSIS — G629 Polyneuropathy, unspecified: Secondary | ICD-10-CM

## 2011-09-05 MED ORDER — ETODOLAC ER 400 MG PO TB24: 400.0000 mg | ORAL_TABLET | Freq: Every day | ORAL | Status: AC

## 2011-09-05 MED ORDER — HYDROCODONE-ACETAMINOPHEN 5-325 MG PO TABS: 1.0000 | ORAL_TABLET | Freq: Four times a day (QID) | ORAL | Status: DC | PRN

## 2011-09-05 MED ORDER — METHOCARBAMOL 500 MG PO TABS: 500.0000 mg | ORAL_TABLET | Freq: Four times a day (QID) | ORAL | Status: DC

## 2011-09-05 MED ORDER — PHENYTOIN SODIUM EXTENDED 100 MG PO CAPS: 200.0000 mg | ORAL_CAPSULE | Freq: Every day | ORAL | Status: DC

## 2011-09-05 MED ORDER — GABAPENTIN 300 MG PO CAPS: 300.0000 mg | ORAL_CAPSULE | Freq: Three times a day (TID) | ORAL | Status: DC

## 2011-09-05 NOTE — Patient Instructions (Addendum)
It was good to meet you today.   I have refilled your medications.    Come back in 3-4 weeks.  We can do the dental referral.  We can also do lab work at that time.    I will have you sign a record release as well.

## 2011-09-06 DIAGNOSIS — G629 Polyneuropathy, unspecified: Secondary | ICD-10-CM | POA: Insufficient documentation

## 2011-09-06 DIAGNOSIS — G40909 Epilepsy, unspecified, not intractable, without status epilepticus: Secondary | ICD-10-CM | POA: Insufficient documentation

## 2011-09-06 DIAGNOSIS — G8929 Other chronic pain: Secondary | ICD-10-CM | POA: Insufficient documentation

## 2011-09-06 NOTE — Assessment & Plan Note (Signed)
Will revisit stopping her antiepileptics after she has had repeat cervical fusion.

## 2011-09-06 NOTE — Assessment & Plan Note (Signed)
Continue Neurontin for this.  We are also obtaining records from [facility] where she is going to have her surgery and where she has been followed.
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
## 2011-09-06 NOTE — Assessment & Plan Note (Signed)
Refill patient on her pain medications. Followup in one month. It seems that she will have some time to go for neck surgery we will have her sign a pain/narcotic contract with [place]. She also feels relief with Etodolac. She's been tried on ibuprofen and [place] in the past but feels that the Etodolac has worked the best

## 2011-11-17 ENCOUNTER — Emergency Department (INDEPENDENT_AMBULATORY_CARE_PROVIDER_SITE_OTHER)
Admission: EM | Admit: 2011-11-17 | Discharge: 2011-11-17 | Disposition: A | Discharge status: Home / Self Care | Payer: Self-pay | Source: Home / Self Care | Attending: Emergency Medicine | Admitting: Emergency Medicine

## 2011-11-17 ENCOUNTER — Encounter (HOSPITAL_COMMUNITY): Payer: Self-pay | Admitting: Emergency Medicine

## 2011-11-17 DIAGNOSIS — B9689 Other specified bacterial agents as the cause of diseases classified elsewhere: Secondary | ICD-10-CM

## 2011-11-17 DIAGNOSIS — Z202 Contact with and (suspected) exposure to infections with a predominantly sexual mode of transmission: Secondary | ICD-10-CM

## 2011-11-17 DIAGNOSIS — N760 Acute vaginitis: Secondary | ICD-10-CM

## 2011-11-17 LAB — POCT URINALYSIS DIP (DEVICE)
Bilirubin Urine: NEGATIVE
Specific Gravity, Urine: 1.02 (ref 1.005–1.030)
pH: 7 (ref 5.0–8.0)

## 2011-11-17 LAB — POCT PREGNANCY, URINE: Preg Test, Ur: NEGATIVE

## 2011-11-17 LAB — WET PREP, GENITAL

## 2011-11-17 MED ORDER — METRONIDAZOLE 500 MG PO TABS: 500.0000 mg | ORAL_TABLET | Freq: Two times a day (BID) | ORAL | Status: DC

## 2011-11-17 MED ORDER — AZITHROMYCIN 250 MG PO TABS
1000.0000 mg | ORAL_TABLET | Freq: Once | ORAL | Status: AC
  Administered 2011-11-17: 1000 mg via ORAL

## 2011-11-17 MED ORDER — AZITHROMYCIN 250 MG PO TABS
ORAL_TABLET | ORAL | Status: AC
  Filled 2011-11-17: qty 4

## 2011-11-17 NOTE — ED Provider Notes (Signed)
Chief Complaint  Patient presents with  . Dysuria    History of Present Illness:   The patient is a 51 year old female who presents tonight with a three-day history of a slight burning with urination, irritation, and urinary frequency. She denies any urgency or blood in urine. She's had no fever, chills, nausea, or vomiting. She also was told by her boyfriend that he tested positive for Chlamydia. She herself denies any symptoms such as pelvic pain, vaginal discharge, or vaginal itching or odor. Her menses have been regular. She denies being pregnant or breast-feeding. She has had no prior history of urinary tract infections or STDs. She takes Dilantin for seizure disorder and had taken Neurontin and Vicodin because of an injury to her right arm but she has tapered off of these meds.  Review of Systems:  Other than noted above, the patient denies any of the following symptoms: Systemic:  No fever, chills, sweats, fatigue, or weight loss. GI:  No abdominal pain, nausea, anorexia, vomiting, diarrhea, constipation, melena or hematochezia. GU:  No dysuria, frequency, urgency, hematuria, vaginal discharge, itching, or abnormal vaginal bleeding. Skin:  No rash or itching.   PMFSH:  Past medical history, family history, social history, meds, and allergies were reviewed.  Physical Exam:   Vital signs:  BP 124/83  Pulse 81  Temp 98.7 F (37.1 C) (Oral)  Resp 12  SpO2 100%  LMP 11/01/2011 General:  Alert, oriented and in no distress. Lungs:  Breath sounds clear and equal bilaterally.  No wheezes, rales or rhonchi. Heart:  Regular rhythm.  No gallops or murmers. Abdomen:  Soft, flat and non-distended.  No organomegaly or mass.  No tenderness, guarding or rebound.  Bowel sounds normally active. Pelvic exam:  Normal external genitalia, vaginal and cervical mucosa was normal. There was a small amount of white, malodorous discharge. No cervical motion pain. Uterus was normal in size and shape and  nontender. No adnexal masses or tenderness. Skin:  Clear, warm and dry.  Labs:   Results for orders placed during the hospital encounter of 11/17/11  WET PREP, GENITAL      Component Value Range   Yeast Wet Prep HPF POC NONE SEEN  NONE SEEN   Trich, Wet Prep NONE SEEN  NONE SEEN   Clue Cells Wet Prep HPF POC FEW (*) NONE SEEN   WBC, Wet Prep HPF POC MODERATE (*) NONE SEEN  POCT URINALYSIS DIP (DEVICE)      Component Value Range   Glucose, UA NEGATIVE  NEGATIVE mg/dL   Bilirubin Urine NEGATIVE  NEGATIVE   Ketones, ur TRACE (*) NEGATIVE mg/dL   Specific Gravity, Urine 1.020  1.005 - 1.030   Hgb urine dipstick TRACE (*) NEGATIVE   pH 7.0  5.0 - 8.0   Protein, ur NEGATIVE  NEGATIVE mg/dL   Urobilinogen, UA 0.2  0.0 - 1.0 mg/dL   Nitrite NEGATIVE  NEGATIVE   Leukocytes, UA TRACE (*) NEGATIVE  POCT PREGNANCY, URINE      Component Value Range   Preg Test, Ur NEGATIVE  NEGATIVE    Other Labs Obtained at Urgent Care Center:  A urine culture was obtained as well as serologies for HIV and syphilis.  Results are pending at this time and we will call about any positive results.  Course in Urgent Care Center:   She was given azithromycin 1000 mg by mouth and tolerated this well without any immediate side effects.  Assessment:  The primary encounter diagnosis was Exposure to STD.  A diagnosis of Bacterial vaginosis was also pertinent to this visit.  Plan:   1.  The following meds were prescribed:   New Prescriptions   METRONIDAZOLE (FLAGYL) 500 MG TABLET    Take 1 tablet (500 mg total) by mouth 2 (two) times daily.   2.  The patient was instructed in symptomatic care and handouts were given. 3.  The patient was told to return if becoming worse in any way, if no better in 3 or 4 days, and given some red flag symptoms that would indicate earlier return.    Reuben Likes, MD 11/17/11 [id number]

## 2011-11-17 NOTE — ED Notes (Signed)
Pt states she has had burning with urination. Also thinks she may have been exposed to an STD.

## 2011-11-18 LAB — GC/CHLAMYDIA PROBE AMP, GENITAL
Chlamydia, DNA Probe: NEGATIVE
GC Probe Amp, Genital: NEGATIVE

## 2011-11-18 LAB — HIV ANTIBODY (ROUTINE TESTING W REFLEX): HIV: NONREACTIVE

## 2011-11-18 NOTE — ED Notes (Signed)
Call from patient regarding cost of Rx; checked w [facility] , and 14 pills flagyl is $15.32, and per Dr Lorenz Coaster, the patient needs to try to afford the Rx . Spoke w pt, and agreeable to the plan

## 2011-11-18 NOTE — ED Notes (Signed)
GC/Chlamydia neg., HIV/RPR non-reactive, Wet prep: few clue cells, mod. WBC's. Urine culture pending.  Pt. adequately treated with Flagyl. Vassie Moselle 11/18/2011

## 2011-11-19 LAB — URINE CULTURE: Special Requests: NORMAL

## 2011-11-24 ENCOUNTER — Telehealth (HOSPITAL_COMMUNITY): Payer: Self-pay | Admitting: Emergency Medicine

## 2011-11-24 ENCOUNTER — Telehealth (HOSPITAL_COMMUNITY): Payer: Self-pay | Admitting: *Deleted

## 2011-11-24 MED ORDER — CIPROFLOXACIN HCL 500 MG PO TABS: 500.0000 mg | ORAL_TABLET | Freq: Two times a day (BID) | ORAL | Status: DC

## 2011-11-24 NOTE — ED Notes (Signed)
Her urine specimen grew out greater than 100,000 colonies of Proteus mirabilis. She was treated with Flagyl and azithromycin. She is allergic to penicillins and sulfa antibiotics. The organism is sensitive to ciprofloxacin, so we'll send her in a course for that.  Reuben Likes, MD 11/24/11 2036

## 2011-11-24 NOTE — ED Notes (Signed)
Urine culture: >100,000 colonies Proteus Mirabilis. Dr. Lorenz Coaster notified and he e-prescribed Cipro 500 mg. BID x 10 days #20 to the [facility] at [facility] [facility] [employer].  I called pt. and left a message to call. Brandi Warren 11/24/2011

## 2011-11-24 NOTE — Telephone Encounter (Signed)
Message copied by Reuben Likes on Thu Nov 24, 2011  8:35 PM ------      Message from: Vassie Moselle      Created: Thu Nov 24, 2011  8:22 PM       Urine culture: >100,000 colonies Proteus Mirabilis.  No tx. noted except Flagyl and Zithromax 1 gm.      Vassie Moselle      11/24/2011

## 2011-12-01 ENCOUNTER — Telehealth (HOSPITAL_COMMUNITY): Payer: Self-pay | Admitting: *Deleted

## 2011-12-01 NOTE — ED Notes (Signed)
Pt. states she lost her phone and just got it back 11/12.  She said she called back and was given her results but no one told her about the Rx. I said they may have only looked at the labs and did not read my note. I asked if she had symptoms of a UTI and she said she feels like something is not right.  I told her the Rx., should still be at the [facility].  She asked if it is on the $4.00 list. I told her I was not sure but to call back if she can't afford it and I would ask the doctor for something cheaper.  She asked for more Flagyl and I told her she would need to be rechecked. Vassie Moselle 12/01/2011

## 2011-12-30 ENCOUNTER — Other Ambulatory Visit: Payer: Self-pay | Admitting: *Deleted

## 2011-12-30 MED ORDER — PHENYTOIN SODIUM EXTENDED 100 MG PO CAPS: 200.0000 mg | ORAL_CAPSULE | Freq: Every day | ORAL | Status: DC

## 2012-04-27 ENCOUNTER — Other Ambulatory Visit: Payer: Self-pay | Admitting: Family Medicine

## 2012-06-09 ENCOUNTER — Other Ambulatory Visit: Payer: Self-pay | Admitting: Family Medicine

## 2012-10-02 ENCOUNTER — Inpatient Hospital Stay (HOSPITAL_COMMUNITY)
Admission: AD | Admit: 2012-10-02 | Discharge: 2012-10-03 | Disposition: A | Discharge status: Home / Self Care | Payer: Self-pay | Source: Ambulatory Visit | Attending: Obstetrics & Gynecology | Admitting: Obstetrics & Gynecology

## 2012-10-02 ENCOUNTER — Inpatient Hospital Stay (HOSPITAL_COMMUNITY): Payer: Self-pay

## 2012-10-02 ENCOUNTER — Encounter (HOSPITAL_COMMUNITY): Payer: Self-pay | Admitting: *Deleted

## 2012-10-02 DIAGNOSIS — R9389 Abnormal findings on diagnostic imaging of other specified body structures: Secondary | ICD-10-CM | POA: Insufficient documentation

## 2012-10-02 DIAGNOSIS — N925 Other specified irregular menstruation: Secondary | ICD-10-CM | POA: Insufficient documentation

## 2012-10-02 DIAGNOSIS — N949 Unspecified condition associated with female genital organs and menstrual cycle: Secondary | ICD-10-CM | POA: Insufficient documentation

## 2012-10-02 DIAGNOSIS — N83209 Unspecified ovarian cyst, unspecified side: Secondary | ICD-10-CM | POA: Insufficient documentation

## 2012-10-02 DIAGNOSIS — R1032 Left lower quadrant pain: Secondary | ICD-10-CM | POA: Insufficient documentation

## 2012-10-02 DIAGNOSIS — N938 Other specified abnormal uterine and vaginal bleeding: Secondary | ICD-10-CM | POA: Insufficient documentation

## 2012-10-02 HISTORY — DX: Unspecified ovarian cyst, unspecified side: N83.209

## 2012-10-02 LAB — URINE MICROSCOPIC-ADD ON

## 2012-10-02 LAB — CBC
Hemoglobin: 12.1 g/dL (ref 12.0–15.0)
MCHC: 32.8 g/dL (ref 30.0–36.0)
Platelets: 223 10*3/uL (ref 150–400)
RBC: 4.02 MIL/uL (ref 3.87–5.11)

## 2012-10-02 LAB — URINALYSIS, ROUTINE W REFLEX MICROSCOPIC
Glucose, UA: NEGATIVE mg/dL
Specific Gravity, Urine: 1.02 (ref 1.005–1.030)

## 2012-10-02 MED ORDER — TRAMADOL HCL 50 MG PO TABS: 50.0000 mg | ORAL_TABLET | Freq: Four times a day (QID) | ORAL | Status: DC | PRN

## 2012-10-02 MED ORDER — NORGESTIMATE-ETH ESTRADIOL 0.25-35 MG-MCG PO TABS: ORAL_TABLET | ORAL | Status: DC

## 2012-10-02 MED ORDER — KETOROLAC TROMETHAMINE 60 MG/2ML IM SOLN
60.0000 mg | Freq: Once | INTRAMUSCULAR | Status: DC
  Filled 2012-10-02: qty 2

## 2012-10-02 NOTE — MAU Note (Signed)
Bleeding x 2 months, pain in R & L side, nausea today.  Somedays bleeding is heavier than others, has strong odor.

## 2012-10-02 NOTE — MAU Provider Note (Signed)
First Provider Initiated Contact with Patient 10/02/12 2046      Chief Complaint:  Vaginal Bleeding   Brandi Warren is  52 y.o. [id number].  Patient's last menstrual period was 09/24/2012.Marland Kitchen  Her pregnancy status is negative.  She presents complaining of vaginal bleeding. Onset is described as ongoing and has been present for 2 months. She reports she goes through a box of 20 tampons in 4 days for the past 2 months. She says sometimes her bleeding is lighter or heavier and sometimes even almost goes away but has been persistent for 2 months. Also has had some suprapubic pain and LLQ pain which is more RLQ today. This pain is sharp on the right side and has been making her nauseated today.   Pertinent Gynecological History: Menses: flow is moderate Bleeding: dysfunctional uterine bleeding Contraception: none DES exposure: denies Blood transfusions: none Sexually transmitted diseases: no past history Previous GYN Procedures: h/o ex lap with removal or 3 ovarian cysts per pt report in 2011  Last mammogram: abnormal - pt says they have always been abnormal Date: 1 year ago per pt report, not in our system. Last pap: normal Date: about 3 years ago per pt report    Past Medical History  Diagnosis Date   Seizures    DDD (degenerative disc disease), cervical    Ovarian cyst     Past Surgical History  Procedure Laterality Date   Cervical fusion  1986 at [facility]   Cholecystectomy     Right arm orif     Ovarian cyst removal      No family history on file.  History  Substance Use Topics   Smoking status: Former Smoker    Quit date: 10/02/1992   Smokeless tobacco: Never Used   Alcohol Use: No    Allergies:  Allergies  Allergen Reactions   Penicillins Other (See Comments)    Childhood allergy, does not remember reaction.   Sulfa Antibiotics Other (See Comments)    seizures    Prescriptions prior to admission  Medication Sig Dispense Refill   ciprofloxacin (CIPRO)  500 MG tablet Take 1 tablet (500 mg total) by mouth every 12 (twelve) hours.  20 tablet  0   ibuprofen (ADVIL,MOTRIN) 200 MG tablet Take 400 mg by mouth every 6 (six) hours as needed. For pain       phenytoin (DILANTIN) 100 MG ER capsule TAKE TWO CAPSULES BY MOUTH ONCE DAILY  60 capsule  1   [DISCONTINUED] gabapentin (NEURONTIN) 300 MG capsule Take 1 capsule (300 mg total) by mouth 3 (three) times daily.  90 capsule  1   [DISCONTINUED] HYDROcodone-acetaminophen (NORCO/VICODIN) 5-325 MG per tablet Take 1 tablet by mouth every 6 (six) hours as needed.  120 tablet  0   [DISCONTINUED] methocarbamol (ROBAXIN) 500 MG tablet Take 1 tablet (500 mg total) by mouth 4 (four) times daily.  120 tablet  0   [DISCONTINUED] metroNIDAZOLE (FLAGYL) 500 MG tablet Take 1 tablet (500 mg total) by mouth 2 (two) times daily.  14 tablet  0     Review of Systems  Review of Systems  Constitutional: Negative for fever, chills, weight loss, malaise/fatigue and diaphoresis.  HENT: Negative for hearing loss, ear pain, nosebleeds, congestion, sore throat, neck pain, tinnitus and ear discharge.   Eyes: Negative for blurred vision, double vision, photophobia, pain, discharge and redness.  Respiratory: Negative for cough, hemoptysis, sputum production, shortness of breath, wheezing and stridor.   Cardiovascular: Negative for chest pain, palpitations, orthopnea,  leg swelling  Gastrointestinal: Negative for abdominal pain heartburn, nausea, vomiting, diarrhea, constipation, blood in stool Genitourinary: Negative for dysuria, urgency, frequency, hematuria and flank pain.  Musculoskeletal: Negative for myalgias, back pain, joint pain and falls.  Skin: Negative for itching and rash.  Neurological: Negative for dizziness, tingling, tremors, sensory change, speech change, focal weakness, seizures, loss of consciousness, weakness and headaches.  Endo/Heme/Allergies: Negative for environmental allergies and polydipsia. Does not  bruise/bleed easily.  Psychiatric/Behavioral: Negative for depression, suicidal ideas, hallucinations, memory loss and substance abuse. The patient is not nervous/anxious and does not have insomnia.      Physical Exam   Blood pressure 96/60, pulse 84, temperature 98 F (36.7 C), temperature source Oral, resp. rate 18, height 5\' 3"  (1.6 m), weight 60.238 kg (132 lb 12.8 oz), last menstrual period 09/24/2012.  General: General appearance - alert, well appearing, and in no distress, oriented to person, place, and time and normal appearing weight Chest - clear to auscultation, no wheezes, rales or rhonchi, symmetric air entry Heart - normal rate, regular rhythm, normal S1, S2, no murmurs, rubs, clicks or gallops Abdomen - soft, nontender, nondistended, no masses or organomegaly Pelvic - normal external genitalia, vulva, vagina, cervix, uterus and adnexa, blood in the vaginal vault, nodular ecto cervix on bimanual exam Extremities - peripheral pulses normal, no pedal edema, no clubbing or cyanosis   Labs: Results for orders placed during the hospital encounter of 10/02/12 (from the past 24 hour(s))  POCT PREGNANCY, URINE   Collection Time    10/02/12  7:24 PM      Result Value Range   Preg Test, Ur NEGATIVE  NEGATIVE  URINALYSIS, ROUTINE W REFLEX MICROSCOPIC   Collection Time    10/02/12  7:40 PM      Result Value Range   Color, Urine YELLOW  YELLOW   APPearance CLEAR  CLEAR   Specific Gravity, Urine 1.020  1.005 - 1.030   pH 7.5  5.0 - 8.0   Glucose, UA NEGATIVE  NEGATIVE mg/dL   Hgb urine dipstick LARGE (*) NEGATIVE   Bilirubin Urine NEGATIVE  NEGATIVE   Ketones, ur NEGATIVE  NEGATIVE mg/dL   Protein, ur NEGATIVE  NEGATIVE mg/dL   Urobilinogen, UA 0.2  0.0 - 1.0 mg/dL   Nitrite NEGATIVE  NEGATIVE   Leukocytes, UA NEGATIVE  NEGATIVE  URINE MICROSCOPIC-ADD ON   Collection Time    10/02/12  7:40 PM      Result Value Range   Squamous Epithelial / LPF FEW (*) RARE   WBC, UA 0-2   <3 WBC/hpf   RBC / HPF 7-10  <3 RBC/hpf   Bacteria, UA FEW (*) RARE  CBC   Collection Time    10/02/12  7:55 PM      Result Value Range   WBC 7.4  4.0 - 10.5 K/uL   RBC 4.02  3.87 - 5.11 MIL/uL   Hemoglobin 12.1  12.0 - 15.0 g/dL   HCT 09.8  11.9 - 14.7 %   MCV 91.8  78.0 - 100.0 fL   MCH 30.1  26.0 - 34.0 pg   MCHC 32.8  30.0 - 36.0 g/dL   RDW 82.9  56.2 - 13.0 %   Platelets 223  150 - 400 K/uL   Imaging Studies:  TVUS: CLINICAL DATA: Vaginal bleeding and pelvic pain  EXAM:  TRANSABDOMINAL AND TRANSVAGINAL ULTRASOUND OF PELVIS  TECHNIQUE:  Both transabdominal and transvaginal ultrasound examinations of the  pelvis were performed. Transabdominal technique was  performed for  global imaging of the pelvis including uterus, ovaries, adnexal  regions, and pelvic cul-de-sac. It was necessary to proceed with  endovaginal exam following the transabdominal exam to visualize the  endometrium and adnexa.  COMPARISON: 09/11/2009  FINDINGS:  Uterus  Measurements: 8 x 5 x 6 cm incidental nabothian cysts.  Endometrium  Thickness: Homogeneous, echogenic 16 mm thick endometrium. No focal  abnormality visualized.  Right ovary  Measurements: 2 x 0.9 x 2.3 cm. Normal appearance/no adnexal mass.  Left ovary  Measurements: 4.9 x 5.6 x 5.3 cm, enlarged secondary to a 4 cm  diameter predominantly anechoic cyst with internal curved septation.  No mural nodule or septal vascularization identified.  Other findings  No free fluid.  IMPRESSION:  1. 16 mm thick endometrium, without focal abnormality. The  endometrial thickness is greater than expected for age, but  significance depends on menopausal state. Recommend gynecologic  referral for consideration of sampling.  2. Complex (septated) 4 cm left ovarian cyst. This can also be  addressed at gynecologic referral.  Assessment: Patient Active Problem List   Diagnosis Date Noted   Chronic neck pain 09/06/2011   Seizure disorder 09/06/2011    Peripheral neuropathy 09/06/2011   Sinus arrhythmia 01/26/2011    Plan: 1) Abnormal Uterine Bleeding -- TVUS shows left ovarian cyst but does not mention fibroid seen previously, endometrial stripe thicker than expected for pt's age -- ultram for pain -- OCP taper to shut down bleeding, Ultram for pain -- EMB at outpt appt. -- sent message to help pt set up appt at [facility]  I spoke with and examined patient and agree with resident's note and plan of care.  Tawana Scale, MD OB Fellow 10/03/2012 12:32 AM  Addendum: Due to pt's high risk of endometrial cancer based on symptoms and [place] will cancel plan for OCPs and call in Megace for bleeding control. Will help pt set up appt in our Gyn Clinic for EMB and FU. Called in Megace 10mg  PO BID and called pt to inform her of the medication change.   Octavio Graves, MD

## 2012-10-02 NOTE — MAU Note (Signed)
Pt denies dysuria

## 2012-10-03 ENCOUNTER — Telehealth: Payer: Self-pay | Admitting: Family Medicine

## 2012-10-03 DIAGNOSIS — N939 Abnormal uterine and vaginal bleeding, unspecified: Secondary | ICD-10-CM

## 2012-10-03 MED ORDER — MEGESTROL ACETATE 20 MG PO TABS: 20.0000 mg | ORAL_TABLET | Freq: Two times a day (BID) | ORAL | Status: DC

## 2012-10-03 MED ORDER — MEGESTROL ACETATE 20 MG PO TABS: ORAL_TABLET | ORAL | Status: DC

## 2012-10-03 MED ORDER — MEGESTROL ACETATE 40 MG PO TABS: ORAL_TABLET | ORAL | Status: DC

## 2012-10-03 NOTE — Telephone Encounter (Signed)
Called pt @ 1025 and left message on her personal voice mail that I was following up call from Dr. Ike Bene earlier today. Her medication to stop the bleeding has been changed and she should pick up new RX from her pharmacy today. She may call back if she has any questions.  *Note:  Rx in system showed to have been printed, not e-prescribed. I re-sent Rx but again did not e-prescribe. I phoned in the Rx and spoke w/pharmacist.

## 2012-10-05 NOTE — MAU Provider Note (Signed)
I spoke with and examined patient and agree with resident's note and plan of care.  Tawana Scale, MD OB Fellow 10/05/2012 8:54 AM

## 2012-10-22 ENCOUNTER — Ambulatory Visit (INDEPENDENT_AMBULATORY_CARE_PROVIDER_SITE_OTHER): Payer: Self-pay | Admitting: Obstetrics and Gynecology

## 2012-10-22 ENCOUNTER — Encounter: Payer: Self-pay | Admitting: Obstetrics and Gynecology

## 2012-10-22 ENCOUNTER — Other Ambulatory Visit (HOSPITAL_COMMUNITY)
Admission: RE | Admit: 2012-10-22 | Discharge: 2012-10-22 | Disposition: A | Discharge status: Home / Self Care | Payer: Self-pay | Source: Ambulatory Visit | Attending: Obstetrics and Gynecology | Admitting: Obstetrics and Gynecology

## 2012-10-22 VITALS — BP 114/84 | HR 90 | Ht 65.0 in | Wt 130.8 lb

## 2012-10-22 DIAGNOSIS — N8502 Endometrial intraepithelial neoplasia [EIN]: Secondary | ICD-10-CM | POA: Insufficient documentation

## 2012-10-22 DIAGNOSIS — N939 Abnormal uterine and vaginal bleeding, unspecified: Secondary | ICD-10-CM | POA: Insufficient documentation

## 2012-10-22 DIAGNOSIS — N926 Irregular menstruation, unspecified: Secondary | ICD-10-CM

## 2012-10-22 DIAGNOSIS — N920 Excessive and frequent menstruation with regular cycle: Secondary | ICD-10-CM

## 2012-10-22 MED ORDER — MEDROXYPROGESTERONE ACETATE 10 MG PO TABS: 10.0000 mg | ORAL_TABLET | Freq: Every day | ORAL | Status: DC

## 2012-10-22 NOTE — Progress Notes (Signed)
Subjective:    Patient ID: Brandi Warren, female    DOB: 1960/08/12, 52 y.o.   MRN: [id number]  HPI 52 yo G1P1 with abnormal uterine bleeding. Patient states she has been bleeding for the past 2 months heavily. Patient states since taking megace, the bleeding has slowed down but has not stopped completely. Patient reports irregular cycles over the past few years. She states that she may have 3-4 months of amenorrhea followed by heavy flow for 3 months. Patient states she was evaluated for a similar bleeding pattern a few years ago.  Past Medical History  Diagnosis Date  . Seizures   . DDD (degenerative disc disease), cervical   . Ovarian cyst    Past Surgical History  Procedure Laterality Date  . Cervical fusion  1986 at [facility]  . Cholecystectomy    . Right arm orif    . Ovarian cyst removal     History reviewed. No pertinent family history. History  Substance Use Topics  . Smoking status: Former Smoker    Quit date: 10/02/1992  . Smokeless tobacco: Never Used  . Alcohol Use: No    Review of Systems     Objective:   Physical Exam GENERAL: Well-developed, well-nourished female in no acute distress.  HEENT: Normocephalic, atraumatic. Sclerae anicteric.  NECK: Supple. Normal thyroid.  LUNGS: Clear to auscultation bilaterally.  HEART: Regular rate and rhythm. BREASTS: Symmetric in size. No palpable masses or lymphadenopathy, skin changes, or nipple drainage. ABDOMEN: Soft, nontender, nondistended. No organomegaly. PELVIC: Normal external female genitalia. Vagina is pink and rugated.  Normal discharge. Normal appearing cervix. Uterus is normal in size.  No adnexal mass or tenderness. EXTREMITIES: No cyanosis, clubbing, or edema, 2+ distal pulses.   Imaging Studies: 09/2012  TVUS: CLINICAL DATA: Vaginal bleeding and pelvic pain  EXAM:  TRANSABDOMINAL AND TRANSVAGINAL ULTRASOUND OF PELVIS  TECHNIQUE:  Both transabdominal and transvaginal ultrasound examinations of the   pelvis were performed. Transabdominal technique was performed for  global imaging of the pelvis including uterus, ovaries, adnexal  regions, and pelvic cul-de-sac. It was necessary to proceed with  endovaginal exam following the transabdominal exam to visualize the  endometrium and adnexa.  COMPARISON: 09/11/2009  FINDINGS:  Uterus  Measurements: 8 x 5 x 6 cm incidental nabothian cysts.  Endometrium  Thickness: Homogeneous, echogenic 16 mm thick endometrium. No focal  abnormality visualized.  Right ovary  Measurements: 2 x 0.9 x 2.3 cm. Normal appearance/no adnexal mass.  Left ovary  Measurements: 4.9 x 5.6 x 5.3 cm, enlarged secondary to a 4 cm  diameter predominantly anechoic cyst with internal curved septation.  No mural nodule or septal vascularization identified.  Other findings  No free fluid.  IMPRESSION:  1. 16 mm thick endometrium, without focal abnormality. The  endometrial thickness is greater than expected for age, but  significance depends on menopausal state. Recommend gynecologic  referral for consideration of sampling.  2. Complex (septated) 4 cm left ovarian cyst. This can also be  addressed at gynecologic referral.      Assessment & Plan:  52 yo with abnormal uterine bleeding. - endometrial biopsy performed ENDOMETRIAL BIOPSY     The indications for endometrial biopsy were reviewed.   Risks of the biopsy including cramping, bleeding, infection, uterine perforation, inadequate specimen and need for additional procedures  were discussed. The patient states she understands and agrees to undergo procedure today. Consent was signed. Time out was performed. Urine HCG was negative. A sterile speculum was placed in the  patient's vagina and the cervix was prepped with Betadine. A single-toothed tenaculum was placed on the anterior lip of the cervix to stabilize it. The uterine cavity was sounded to a depth of 7 cm using the uterine sound. The 3 mm pipelle was introduced  into the endometrial cavity without difficulty, 2 passes were made.  A  moderate amount of tissue was  sent to pathology. The instruments were removed from the patient's vagina. Minimal bleeding from the cervix was noted. The patient tolerated the procedure well.  Routine post-procedure instructions were given to the patient. The patient will follow up in two weeks to review the results and for further management.   - pap smear and referral for mammography performed - ca-125 collected secondary to septated cyst - RTC in 2 weeks for results and further management. Patient recently relocated to [facility] area and desires to follow up in out [facility] office

## 2012-10-23 LAB — CA 125: CA 125: 12.3 U/mL (ref 0.0–30.2)

## 2012-10-31 ENCOUNTER — Telehealth: Payer: Self-pay

## 2012-10-31 NOTE — Telephone Encounter (Signed)
Scheduled GYN/ONC appt for October 31st @ 1100am with Dr. Stanford Breed.  She is to arrive at 1030 am.  Forward this appt to Dr. Jolayne Panther as instructed by her.

## 2012-10-31 NOTE — Telephone Encounter (Signed)
Message copied by Faythe Casa on Wed Oct 31, 2012  8:15 AM ------      Message from: CONSTANT, Gigi Gin      Created: Tue Oct 30, 2012  7:14 AM       Still in need of that referral for GYN ONC secondary to complex hyperplasia with atypia on endometrial biopsy.            Please in basket me appointment time and I will inform the patient. She does not have a follow up appointment scheduled and I will call her.            Thanks            Clinical cytogeneticist ------

## 2012-11-07 ENCOUNTER — Encounter: Payer: Self-pay | Admitting: *Deleted

## 2012-11-16 ENCOUNTER — Encounter: Payer: Self-pay | Admitting: Gynecology

## 2012-11-16 ENCOUNTER — Ambulatory Visit: Payer: Self-pay | Attending: Gynecology | Admitting: Gynecology

## 2012-11-16 VITALS — BP 132/75 | HR 88 | Temp 98.8°F | Resp 16 | Ht 65.0 in | Wt 132.6 lb

## 2012-11-16 DIAGNOSIS — Z79899 Other long term (current) drug therapy: Secondary | ICD-10-CM | POA: Insufficient documentation

## 2012-11-16 DIAGNOSIS — N83209 Unspecified ovarian cyst, unspecified side: Secondary | ICD-10-CM | POA: Insufficient documentation

## 2012-11-16 DIAGNOSIS — N8502 Endometrial intraepithelial neoplasia [EIN]: Secondary | ICD-10-CM | POA: Insufficient documentation

## 2012-11-16 NOTE — Patient Instructions (Signed)
Plan to follow up with Dr. Jolayne Panther for a D&C in the future.  Please call for any questions or concerns.

## 2012-11-16 NOTE — Progress Notes (Signed)
Consult Note: Gyn-Onc   Brandi Warren 52 y.o. female  Chief Complaint  Patient presents with  . 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  Plan: I recommend the patient undergo a formal fractional D&C for complete evaluation of her endometrium. If the final pathology shows only hyperplasia, would recommend that Dr. Jolayne Panther proceed with hysterectomy and bilateral salpingo-oophorectomy.  On the other hand if an endometrial carcinoma were found, we would be happy to see the patient back to plan more definitive cancer therapy.  The patient is instructed to contact Dr. Deretha Emory office to arrange for the [facility].   HPI:  52 year old white female seen in consultation at the request of Dr. Jolayne Panther regarding management of complex hyperplasia with focal atypia of the endometrium. The patient has had abnormal bleeding off and on for last several years. This initially treated with Megace but apparently the bleeding continued. Subsequently the patient has been placed on Provera which seems to have reduce the amount of bleeding considerably. She has minimal lower abdominal and pelvic discomfort. A pelvic ultrasound was also revealed a 4 cm cyst with a septation in the left ovary. The patient is being a history of having ON ovarian cyst removed at laparoscopy many years ago.  Review of Systems:10 point review of systems is negative except as noted in interval history.   Vitals: Blood pressure 132/75, pulse 88, temperature 98.8 F (37.1 C), temperature source Oral, resp. rate 16, height 5\' 5"  (1.651 m), weight 132 lb 9.6 oz (60.147 kg).  Physical Exam: General : The patient is a healthy woman in no acute distress.  HEENT: normocephalic, extraoccular movements normal; neck is supple without thyromegally  Lynphnodes: Supraclavicular and inguinal nodes not enlarged  Abdomen: Soft, non-tender, no ascites, no  organomegally, no masses, no hernias  Pelvic:  EGBUS: Normal female  Vagina: Normal, no lesions  Urethra and Bladder: Normal, non-tender  Cervix: Normal Uterus: Anterior normal shape size and consistency Bi-manual examination: Non-tender; no adenxal masses or nodularity  Rectal: normal sphincter tone, no masses, no blood  Lower extremities: No edema or varicosities. Normal range of motion      Allergies  Allergen Reactions  . Penicillins Other (See Comments)    Childhood allergy, does not remember reaction.  . Sulfa Antibiotics Other (See Comments)    seizures    Past Medical History  Diagnosis Date  . Seizures   . DDD (degenerative disc disease), cervical   . Ovarian cyst     Past Surgical History  Procedure Laterality Date  . Cervical fusion  1986 at [facility]  . Cholecystectomy    . Right arm orif    . Ovarian cyst removal      Current Outpatient Prescriptions  Medication Sig Dispense Refill  . ibuprofen (ADVIL,MOTRIN) 200 MG tablet Take 400 mg by mouth every 6 (six) hours as needed. For pain      . medroxyPROGESTERone (PROVERA) 10 MG tablet Take 1 tablet (10 mg total) by mouth daily.  30 tablet  12  . megestrol (MEGACE) 20 MG tablet Take 1 tablet twice daily until bleeding stops then once daily for 5 more days  30 tablet  0  . phenytoin (DILANTIN) 100 MG ER capsule TAKE TWO CAPSULES BY MOUTH ONCE DAILY  60 capsule  1  . traMADol (ULTRAM) 50 MG tablet Take 1 tablet (50 mg total) by mouth every 6 (six) hours as needed for pain.  30 tablet  0   No current facility-administered medications for this visit.    History   Social History  . Marital Status: Divorced    Spouse Name: N/A    Number of Children: N/A  . Years of Education: N/A   Occupational History  . Not on file.   Social History Main Topics  . Smoking status: Former Smoker    Quit date: 10/02/1992  . Smokeless tobacco: Never Used  . Alcohol Use: No  . Drug Use: No  . Sexual Activity: Yes   Other  Topics Concern  . Not on file   Social History Narrative  . No narrative on file    No family history on file.    Brandi Corpus, MD 11/16/2012, 3:49 PM

## 2012-11-20 ENCOUNTER — Telehealth: Payer: Self-pay | Admitting: *Deleted

## 2012-11-20 ENCOUNTER — Other Ambulatory Visit: Payer: Self-pay | Admitting: Obstetrics and Gynecology

## 2012-11-20 NOTE — Telephone Encounter (Signed)
Pt left message stating that she saw Dr. Stanford Breed and is being referred back to Dr. Jolayne Panther for D&C. She would like surgery scheduled on a Friday. I spoke with Dr. Jolayne Panther and she stated that she will schedule the procedure. She can talk with pt by phone if she has additional questions about the D&C. I called pt and informed her that surgery will be scheduled and she will be notified of the date & time. She stated that she does not have any questions at this time.

## 2012-11-22 ENCOUNTER — Telehealth: Payer: Self-pay | Admitting: *Deleted

## 2012-11-22 NOTE — Telephone Encounter (Signed)
Spoke to patient to try to set up appointment, she said that the surgery was already in process of being scheduled and she was under the impression that she needed to follow up here with [place] in the [facility] clinic following the surgery.  She already has her test results and recommendations from [facility].

## 2012-11-22 NOTE — Telephone Encounter (Signed)
Message copied by Barbara Cower on Thu Nov 22, 2012  3:37 PM ------      Message from: Tse Bonito, Gigi Gin      Created: Sun Nov 18, 2012  7:38 AM       Please contact patient to schedule an appointment to schedule D&C secondary to abnormal endometrial biopsy as per GYN ONC                  Patient was seen at [facility] clinic but lives in [place].            Thanks            Clinical cytogeneticist ------

## 2012-12-10 ENCOUNTER — Other Ambulatory Visit (HOSPITAL_COMMUNITY): Payer: Self-pay | Admitting: *Deleted

## 2012-12-10 DIAGNOSIS — N6310 Unspecified lump in the right breast, unspecified quadrant: Secondary | ICD-10-CM

## 2012-12-11 ENCOUNTER — Ambulatory Visit (HOSPITAL_COMMUNITY): Payer: Self-pay

## 2012-12-26 ENCOUNTER — Other Ambulatory Visit: Payer: Self-pay

## 2013-01-08 ENCOUNTER — Ambulatory Visit (HOSPITAL_COMMUNITY): Payer: Self-pay

## 2013-01-11 ENCOUNTER — Other Ambulatory Visit: Payer: Self-pay

## 2013-01-14 ENCOUNTER — Encounter (HOSPITAL_COMMUNITY): Payer: Self-pay | Admitting: Pharmacist

## 2013-01-23 ENCOUNTER — Encounter (HOSPITAL_COMMUNITY): Payer: Self-pay

## 2013-01-23 ENCOUNTER — Encounter (HOSPITAL_COMMUNITY)
Admission: RE | Admit: 2013-01-23 | Discharge: 2013-01-23 | Disposition: A | Discharge status: Home / Self Care | Payer: 59 | Source: Ambulatory Visit | Attending: Obstetrics and Gynecology | Admitting: Obstetrics and Gynecology

## 2013-01-23 DIAGNOSIS — Z01818 Encounter for other preprocedural examination: Secondary | ICD-10-CM | POA: Insufficient documentation

## 2013-01-23 DIAGNOSIS — Z01812 Encounter for preprocedural laboratory examination: Secondary | ICD-10-CM | POA: Insufficient documentation

## 2013-01-23 HISTORY — DX: Cardiac murmur, unspecified: R01.1

## 2013-01-23 LAB — CBC
HCT: 36.6 % (ref 36.0–46.0)
HEMOGLOBIN: 12.3 g/dL (ref 12.0–15.0)
MCH: 30.7 pg (ref 26.0–34.0)
MCHC: 33.6 g/dL (ref 30.0–36.0)
MCV: 91.3 fL (ref 78.0–100.0)
PLATELETS: 191 10*3/uL (ref 150–400)
RBC: 4.01 MIL/uL (ref 3.87–5.11)
RDW: 12.8 % (ref 11.5–15.5)
WBC: 7.4 10*3/uL (ref 4.0–10.5)

## 2013-01-23 NOTE — Patient Instructions (Addendum)
Your procedure is scheduled on: 01/25/2013  Enter through the Main Entrance of [facility] at: [place] up the phone at the desk and dial [phone].  Call this number if you have problems the morning of surgery: [phone].  Remember: Do NOT eat food: AFTER MIDNIGHT THURSDAY Do NOT drink clear liquids after: AFTER 0800AM DAY OF SURGERY Take these medicines the morning of surgery with a SIP OF WATER: None  Do NOT wear jewelry (body piercing), make-up, or nail polish. Do NOT wear lotions, powders, or perfumes.  You may wear deoderant. Do NOT shave for 48 hours prior to surgery. Do NOT bring valuables to the hospital. Contacts, dentures, or bridgework may not be worn into surgery.  Have a responsible adult drive you home and stay with you for 24 hours after your procedure

## 2013-01-25 ENCOUNTER — Encounter (HOSPITAL_COMMUNITY): Payer: 59 | Admitting: Anesthesiology

## 2013-01-25 ENCOUNTER — Ambulatory Visit (HOSPITAL_COMMUNITY): Payer: 59 | Admitting: Anesthesiology

## 2013-01-25 ENCOUNTER — Encounter (HOSPITAL_COMMUNITY): Payer: Self-pay | Admitting: Anesthesiology

## 2013-01-25 ENCOUNTER — Encounter (HOSPITAL_COMMUNITY)
Admission: RE | Disposition: A | Discharge status: Home / Self Care | Payer: Self-pay | Source: Ambulatory Visit | Attending: Obstetrics and Gynecology

## 2013-01-25 ENCOUNTER — Ambulatory Visit (HOSPITAL_COMMUNITY)
Admission: RE | Admit: 2013-01-25 | Discharge: 2013-01-25 | Disposition: A | Discharge status: Home / Self Care | Payer: 59 | Source: Ambulatory Visit | Attending: Obstetrics and Gynecology | Admitting: Obstetrics and Gynecology

## 2013-01-25 DIAGNOSIS — N925 Other specified irregular menstruation: Secondary | ICD-10-CM | POA: Insufficient documentation

## 2013-01-25 DIAGNOSIS — N939 Abnormal uterine and vaginal bleeding, unspecified: Secondary | ICD-10-CM

## 2013-01-25 DIAGNOSIS — N938 Other specified abnormal uterine and vaginal bleeding: Secondary | ICD-10-CM | POA: Insufficient documentation

## 2013-01-25 DIAGNOSIS — N949 Unspecified condition associated with female genital organs and menstrual cycle: Secondary | ICD-10-CM | POA: Insufficient documentation

## 2013-01-25 DIAGNOSIS — N854 Malposition of uterus: Secondary | ICD-10-CM | POA: Insufficient documentation

## 2013-01-25 DIAGNOSIS — N926 Irregular menstruation, unspecified: Secondary | ICD-10-CM

## 2013-01-25 DIAGNOSIS — N8502 Endometrial intraepithelial neoplasia [EIN]: Secondary | ICD-10-CM | POA: Insufficient documentation

## 2013-01-25 HISTORY — PX: DILATION AND CURETTAGE OF UTERUS: SHX78

## 2013-01-25 SURGERY — DILATION AND CURETTAGE
Anesthesia: General | Site: Vagina

## 2013-01-25 MED ORDER — ONDANSETRON HCL 4 MG/2ML IJ SOLN
INTRAMUSCULAR | Status: AC
  Filled 2013-01-25: qty 2

## 2013-01-25 MED ORDER — PROPOFOL 10 MG/ML IV EMUL
INTRAVENOUS | Status: AC
  Filled 2013-01-25: qty 20

## 2013-01-25 MED ORDER — MEPERIDINE HCL 25 MG/ML IJ SOLN: 6.2500 mg | INTRAMUSCULAR | Status: DC | PRN

## 2013-01-25 MED ORDER — LIDOCAINE HCL (CARDIAC) 20 MG/ML IV SOLN
INTRAVENOUS | Status: AC
  Filled 2013-01-25: qty 5

## 2013-01-25 MED ORDER — LIDOCAINE HCL (CARDIAC) 20 MG/ML IV SOLN
INTRAVENOUS | Status: AC
Start: 2013-01-25 — End: 2013-01-25
  Filled 2013-01-25: qty 5

## 2013-01-25 MED ORDER — LIDOCAINE HCL (CARDIAC) 20 MG/ML IV SOLN
INTRAVENOUS | Status: DC | PRN
  Administered 2013-01-25: 50 mg via INTRAVENOUS

## 2013-01-25 MED ORDER — FENTANYL CITRATE 0.05 MG/ML IJ SOLN
INTRAMUSCULAR | Status: AC
  Filled 2013-01-25: qty 2

## 2013-01-25 MED ORDER — OXYCODONE-ACETAMINOPHEN 5-325 MG PO TABS: 1.0000 | ORAL_TABLET | ORAL | Status: DC | PRN

## 2013-01-25 MED ORDER — CHLOROPROCAINE HCL 1 % IJ SOLN
INTRAMUSCULAR | Status: AC
  Filled 2013-01-25: qty 30

## 2013-01-25 MED ORDER — IBUPROFEN 600 MG PO TABS: 600.0000 mg | ORAL_TABLET | Freq: Four times a day (QID) | ORAL | Status: DC | PRN

## 2013-01-25 MED ORDER — MIDAZOLAM HCL 2 MG/2ML IJ SOLN
INTRAMUSCULAR | Status: AC
  Filled 2013-01-25: qty 2

## 2013-01-25 MED ORDER — DEXAMETHASONE SODIUM PHOSPHATE 10 MG/ML IJ SOLN
INTRAMUSCULAR | Status: AC
  Filled 2013-01-25: qty 1

## 2013-01-25 MED ORDER — ONDANSETRON HCL 4 MG/2ML IJ SOLN
INTRAMUSCULAR | Status: DC | PRN
  Administered 2013-01-25: 4 mg via INTRAVENOUS

## 2013-01-25 MED ORDER — FENTANYL CITRATE 0.05 MG/ML IJ SOLN
INTRAMUSCULAR | Status: DC | PRN
  Administered 2013-01-25 (×2): 50 ug via INTRAVENOUS

## 2013-01-25 MED ORDER — KETOROLAC TROMETHAMINE 30 MG/ML IJ SOLN
INTRAMUSCULAR | Status: AC
  Filled 2013-01-25: qty 1

## 2013-01-25 MED ORDER — LACTATED RINGERS IV SOLN
INTRAVENOUS | Status: DC
  Administered 2013-01-25: 12:00:00 via INTRAVENOUS

## 2013-01-25 MED ORDER — CHLOROPROCAINE HCL 1 % IJ SOLN
INTRAMUSCULAR | Status: DC | PRN
  Administered 2013-01-25: 10 mL

## 2013-01-25 MED ORDER — MIDAZOLAM HCL 2 MG/2ML IJ SOLN: 0.5000 mg | Freq: Once | INTRAMUSCULAR | Status: DC | PRN

## 2013-01-25 MED ORDER — KETOROLAC TROMETHAMINE 30 MG/ML IJ SOLN: 15.0000 mg | Freq: Once | INTRAMUSCULAR | Status: DC | PRN

## 2013-01-25 MED ORDER — MIDAZOLAM HCL 2 MG/2ML IJ SOLN
INTRAMUSCULAR | Status: DC | PRN
  Administered 2013-01-25: 2 mg via INTRAVENOUS

## 2013-01-25 MED ORDER — FENTANYL CITRATE 0.05 MG/ML IJ SOLN: 25.0000 ug | INTRAMUSCULAR | Status: DC | PRN

## 2013-01-25 MED ORDER — PROPOFOL 10 MG/ML IV BOLUS
INTRAVENOUS | Status: DC | PRN
  Administered 2013-01-25: 200 mg via INTRAVENOUS

## 2013-01-25 MED ORDER — PROMETHAZINE HCL 25 MG/ML IJ SOLN: 6.2500 mg | INTRAMUSCULAR | Status: DC | PRN

## 2013-01-25 SURGICAL SUPPLY — 17 items
CATH ROBINSON RED A/P 16FR (CATHETERS) ×2 IMPLANT
DECANTER SPIKE VIAL GLASS SM (MISCELLANEOUS) ×2 IMPLANT
DRSG TELFA 3X8 NADH (GAUZE/BANDAGES/DRESSINGS) ×2 IMPLANT
GLOVE BIOGEL PI IND STRL 6.5 (GLOVE) ×1 IMPLANT
GLOVE BIOGEL PI INDICATOR 6.5 (GLOVE) ×1
GLOVE SURG SS PI 6.0 STRL IVOR (GLOVE) ×2 IMPLANT
GOWN STRL REIN XL XLG (GOWN DISPOSABLE) ×4 IMPLANT
NDL SPNL 22GX3.5 QUINCKE BK (NEEDLE) ×1 IMPLANT
NEEDLE SPNL 22GX3.5 QUINCKE BK (NEEDLE) ×2 IMPLANT
NS IRRIG 1000ML POUR BTL (IV SOLUTION) ×2 IMPLANT
PACK VAGINAL MINOR WOMEN LF (CUSTOM PROCEDURE TRAY) ×2 IMPLANT
PAD DRESSING TELFA 3X8 NADH (GAUZE/BANDAGES/DRESSINGS) ×1 IMPLANT
PAD OB MATERNITY 4.3X12.25 (PERSONAL CARE ITEMS) ×2 IMPLANT
PAD PREP 24X48 CUFFED NSTRL (MISCELLANEOUS) ×2 IMPLANT
SYR CONTROL 10ML LL (SYRINGE) ×2 IMPLANT
TOWEL OR 17X24 6PK STRL BLUE (TOWEL DISPOSABLE) ×4 IMPLANT
WATER STERILE IRR 1000ML POUR (IV SOLUTION) ×2 IMPLANT

## 2013-01-25 NOTE — Op Note (Signed)
PROCEDURE DATE: 01/25/2013  PREOPERATIVE DIAGNOSIS: Abnormal uterine bleeding with complex hyperplasia with atypia on endometrial biopsy POSTOPERATIVE DIAGNOSIS: The same. PROCEDURE:     Dilation and Evacuation. SURGEON:  Dr. Elly Modena  INDICATIONS: 53 y.o. yo [id number] with complex hyperplasia with focal atypia on endometrial biopsy, needing further sampling.  Risks of surgery were discussed with the patient including but not limited to: bleeding which may require transfusion; infection which may require antibiotics; injury to uterus or surrounding organs;need for additional procedures including laparotomy or laparoscopy; possibility of intrauterine scarring which may impair future fertility; and other postoperative/anesthesia complications. Written informed consent was obtained.    FINDINGS:  A 8 size anteverted uterus, moderate amounts of products of conception, specimen sent to pathology.  ANESTHESIA:    Monitored intravenous sedation, paracervical block. INTRAVENOUS FLUIDS:  100 ml of LR ESTIMATED BLOOD LOSS:  Less than 20 ml. SPECIMENS:  Endometrial curretting sent to pathology COMPLICATIONS:  None immediate.  PROCEDURE DETAILS:  The patient received intravenous antibiotics while in the preoperative area.  She was then taken to the operating room where general anesthesia was administered and was found to be adequate.  After an adequate timeout was performed, she was placed in the dorsal lithotomy position and examined; then prepped and draped in the sterile manner.   Her bladder was catheterized for an unmeasured amount of clear, yellow urine. A vaginal speculum was then placed in the patient's vagina and a single tooth tenaculum was applied to the anterior lip of the cervix.  A paracervical block using 1% Marcaine was administered. The cervix was gently dilated through serial insertion of Hagar dilators to accommodate a 2 Sims currette.  A sharp curettage was then performed to cover the entire  endometrial surface of the uterus.There was minimal bleeding noted and the tenaculum removed with good hemostasis noted.  The patient tolerated the procedure well.  The patient was taken to the recovery area in stable condition.

## 2013-01-25 NOTE — H&P (Signed)
Brandi Warren is an 53 y.o. female with abnormal uterine bleeding and complex hyperplasia with focal atypia who is here for D&C.  Pertinent Gynecological History: Bleeding: dysfunctional uterine bleeding  DES exposure: denies Blood transfusions: none Sexually transmitted diseases: no past history Last pap: normal Date: 10/2012 OB History: G3, P1021   Menstrual History: No LMP recorded. Patient is not currently having periods (Reason: Other).    Past Medical History  Diagnosis Date  . Seizures   . DDD (degenerative disc disease), cervical   . Ovarian cyst   . Heart murmur     history of heart murmur    Past Surgical History  Procedure Laterality Date  . Cervical fusion  1986 at [facility]  . Cholecystectomy    . Right arm orif    . Ovarian cyst removal      No family history on file.  Social History:  reports that she quit smoking about 20 years ago. She has never used smokeless tobacco. She reports that she does not drink alcohol or use illicit drugs.  Allergies:  Allergies  Allergen Reactions  . Sulfa Antibiotics Other (See Comments)    seizures    Prescriptions prior to admission  Medication Sig Dispense Refill  . ibuprofen (ADVIL,MOTRIN) 200 MG tablet Take 800 mg by mouth every 6 (six) hours as needed. For pain      . medroxyPROGESTERone (PROVERA) 10 MG tablet Take 1 tablet (10 mg total) by mouth daily.  30 tablet  12  . phenytoin (DILANTIN) 100 MG ER capsule TAKE TWO CAPSULES BY MOUTH ONCE DAILY  60 capsule  1  . traMADol (ULTRAM) 50 MG tablet Take 100 mg by mouth every 6 (six) hours as needed for pain.        ROS  Blood pressure 127/65, pulse 77, temperature 97.9 F (36.6 C), temperature source Oral, resp. rate 20, SpO2 100.00%. Physical Exam GENERAL: Well-developed, well-nourished female in no acute distress.  HEENT: Normocephalic, atraumatic. Sclerae anicteric.  NECK: Supple. Normal thyroid.  LUNGS: Clear to auscultation bilaterally.  HEART: Regular rate  and rhythm. ABDOMEN: Soft, nontender, nondistended. No organomegaly. PELVIC: Deferred to OR EXTREMITIES: No cyanosis, clubbing, or edema, 2+ distal pulses.   No results found for this or any previous visit (from the past 24 hour(s)).  No results found.  Assessment/Plan: 53 yo with abnormal uterine bleeding and complex hyperplasia with focal atypia on endometrial biospy here dilatation and curretage as recommended by Gyn Onc - risks, benefits and alternatives were explained, including but not limited to risks of bleeding, infection , uterine perforation, and damage to adjacent organs. Patient verbalized understanding and all questions were answered  Brandi Warren 01/25/2013, 12:23 PM

## 2013-01-25 NOTE — Anesthesia Postprocedure Evaluation (Signed)
  Anesthesia Post Note  Patient: Brandi Warren  Procedure(s) Performed: Procedure(s) (LRB): DILATATION AND CURETTAGE (N/A)  Anesthesia type: GA  Patient location: PACU  Post pain: Pain level controlled  Post assessment: Post-op Vital signs reviewed  Last Vitals:  Filed Vitals:   01/25/13 1358  BP: 104/48  Pulse:   Temp: 36.8 C  Resp: 12    Post vital signs: Reviewed  Level of consciousness: sedated  Complications: No apparent anesthesia complications

## 2013-01-25 NOTE — Anesthesia Preprocedure Evaluation (Signed)
Anesthesia Evaluation  Patient identified by MRN, date of birth, ID band Patient awake    Reviewed: Allergy & Precautions, H&P , Patient's Chart, lab work & pertinent test results, reviewed documented beta blocker date and time   History of Anesthesia Complications Negative for: history of anesthetic complications  Airway Mallampati: II TM Distance: >3 FB Neck ROM: full    Dental   Pulmonary former smoker,  breath sounds clear to auscultation        Cardiovascular Exercise Tolerance: Good Rhythm:regular Rate:Normal     Neuro/Psych Seizures -,   Neuromuscular disease negative psych ROS   GI/Hepatic   Endo/Other    Renal/GU      Musculoskeletal   Abdominal   Peds  Hematology   Anesthesia Other Findings DDD (degenerative disc disease), cervical        Ovarian cyst     Heart murmur    Reproductive/Obstetrics                           Anesthesia Physical Anesthesia Plan  ASA: II  Anesthesia Plan: General LMA   Post-op Pain Management:    Induction:   Airway Management Planned:   Additional Equipment:   Intra-op Plan:   Post-operative Plan:   Informed Consent: I have reviewed the patients History and Physical, chart, labs and discussed the procedure including the risks, benefits and alternatives for the proposed anesthesia with the patient or authorized representative who has indicated his/her understanding and acceptance.   Dental Advisory Given  Plan Discussed with: CRNA, Surgeon and Anesthesiologist  Anesthesia Plan Comments:         Anesthesia Quick Evaluation

## 2013-01-25 NOTE — Transfer of Care (Signed)
Immediate Anesthesia Transfer of Care Note  Patient: Brandi Warren  Procedure(s) Performed: Procedure(s): DILATATION AND CURETTAGE (N/A)  Patient Location: PACU  Anesthesia Type:General  Level of Consciousness: awake, alert  and oriented  Airway & Oxygen Therapy: Patient Spontanous Breathing and Patient connected to nasal cannula oxygen  Post-op Assessment: Report given to PACU RN and Post -op Vital signs reviewed and stable  Post vital signs: Reviewed and stable  Complications: No apparent anesthesia complications

## 2013-01-25 NOTE — Discharge Instructions (Signed)
Dilation and Curettage or Vacuum Curettage Care After Refer to this sheet in the next few weeks. These instructions provide you with general information on caring for yourself after your procedure. Your caregiver may also give you more specific instructions. Your treatment has been planned according to current medical practices, but problems sometimes occur. Call your caregiver if you have any problems or questions after your procedure. HOME CARE INSTRUCTIONS   Do not drive for 24 hours.  Wait 1 week before returning to strenuous activities.  Take your temperature 2 times a day for 4 days and write it down. Provide these temperatures to your caregiver if you develop a fever.  Avoid long periods of standing, and do no heavy lifting (more than 10 pounds or 4.5 kg), pushing, or pulling.  Limit stair climbing to once or twice a day.  Take rest periods often.  You may resume your usual diet.  Drink enough fluids to keep your urine clear or pale yellow.  You should return to your usual bowel function. If constipation should occur, you may:  Take a mild laxative with permission from your caregiver.  Add fruit and bran to your diet.  Drink more fluids.  Take showers instead of baths until your caregiver gives you permission to take baths.  Do not go swimming or use a hot tub until your caregiver gives you permission.  Try to have someone with you or available to you the first 24 to 48 hours, especially if you had a general anesthetic.  Do not douche, use tampons, or have intercourse until after your follow-up appointment, or when your caregiver approves.  Only take over-the-counter or prescription medicines for pain, discomfort, or fever as directed by your caregiver. Do not take aspirin. It can cause bleeding.  If a prescription was given, follow your caregiver's directions.  Keep all your follow-up appointments recommended by your caregiver. SEEK MEDICAL CARE IF:   You have  increasing cramps or pain not relieved with medicine.  You have abdominal pain which does not seem to be related to the same area of earlier cramping and pain.  You have bad smelling vaginal discharge.  You have a rash.  You have problems with any medicine. SEEK IMMEDIATE MEDICAL CARE IF:   You have bleeding that is heavier than a normal menstrual period.  You have a fever.  You have chest pain.  You have shortness of breath.  You feel dizzy or feel like fainting.  You pass out.  You have pain in your shoulder strap area.  You have heavy vaginal bleeding with or without blood clots. MAKE SURE YOU:   Understand these instructions.  Will watch your condition.  Will get help right away if you are not doing well or get worse. Document Released: 01/01/2000 Document Revised: 03/28/2011 Document Reviewed: 08/02/2012 [facility] Patient Information 2014 [place], [place].  Post Anesthesia Home Care Instructions  Activity: Get plenty of rest for the remainder of the day. A responsible adult should stay with you for 24 hours following the procedure.  For the next 24 hours, DO NOT: -Drive a car -Paediatric nurse -Drink alcoholic beverages -Take any medication unless instructed by your physician -Make any legal decisions or sign important papers.  Meals: Start with liquid foods such as gelatin or soup. Progress to regular foods as tolerated. Avoid greasy, spicy, heavy foods. If nausea and/or vomiting occur, drink only clear liquids until the nausea and/or vomiting subsides. Call your physician if vomiting continues.  Special Instructions/Symptoms: Your  throat may feel dry or sore from the anesthesia or the breathing tube placed in your throat during surgery. If this causes discomfort, gargle with warm salt water. The discomfort should disappear within 24 hours.

## 2013-01-28 ENCOUNTER — Encounter (HOSPITAL_COMMUNITY): Payer: Self-pay | Admitting: Obstetrics and Gynecology

## 2013-01-30 ENCOUNTER — Telehealth: Payer: Self-pay | Admitting: *Deleted

## 2013-01-30 NOTE — Telephone Encounter (Signed)
Called  Gyn oncology at [phone] and spoke with a nurse , she states they have sent the message to Dr. Fermin Schwab and Lenna Sciara and are expecting an answer this am, she will call back later this am.

## 2013-01-30 NOTE — Telephone Encounter (Signed)
Patient called back. She is aware of her appt with GYN ONC they called her. She is calling to request results. A note was sent to Dr Elly Modena to call patient.

## 2013-01-30 NOTE — Telephone Encounter (Signed)
Message copied by Samuel Germany on Wed Jan 30, 2013  8:34 AM ------      Message from: CONSTANT, Vickii Chafe      Created: Tue Jan 29, 2013  9:58 AM       Please follow up with GYN ONC regarding surgical plan. Patient strongly desires to have hysterectomy done by GYN ONC. Messages were sent to Dr. Aldean Ast and Joylene John. Please contact their office for plan of care and I will be happy to inform patient of her results and appointments.            Thanks            Clinical cytogeneticist ------

## 2013-01-30 NOTE — Telephone Encounter (Signed)
Received an appointment for Dr. Fermin Schwab for 1/01/22/13 Friday for Brandi Warren- needs to be there 0815 to register. Called [facility] and left a message we are trying to reach you re: a referral appointment- please call office

## 2013-02-01 ENCOUNTER — Ambulatory Visit: Payer: 59 | Attending: Gynecology | Admitting: Gynecology

## 2013-02-01 ENCOUNTER — Encounter: Payer: Self-pay | Admitting: Gynecology

## 2013-02-01 VITALS — BP 113/82 | HR 83 | Temp 98.6°F | Resp 16 | Wt 138.4 lb

## 2013-02-01 DIAGNOSIS — C549 Malignant neoplasm of corpus uteri, unspecified: Secondary | ICD-10-CM | POA: Insufficient documentation

## 2013-02-01 DIAGNOSIS — Z981 Arthrodesis status: Secondary | ICD-10-CM | POA: Insufficient documentation

## 2013-02-01 DIAGNOSIS — Z79899 Other long term (current) drug therapy: Secondary | ICD-10-CM | POA: Insufficient documentation

## 2013-02-01 DIAGNOSIS — Z87891 Personal history of nicotine dependence: Secondary | ICD-10-CM | POA: Insufficient documentation

## 2013-02-01 DIAGNOSIS — M5030 Other cervical disc degeneration, unspecified cervical region: Secondary | ICD-10-CM | POA: Insufficient documentation

## 2013-02-01 DIAGNOSIS — C541 Malignant neoplasm of endometrium: Secondary | ICD-10-CM | POA: Insufficient documentation

## 2013-02-01 DIAGNOSIS — R569 Unspecified convulsions: Secondary | ICD-10-CM | POA: Insufficient documentation

## 2013-02-01 DIAGNOSIS — Z9089 Acquired absence of other organs: Secondary | ICD-10-CM | POA: Insufficient documentation

## 2013-02-01 DIAGNOSIS — N83209 Unspecified ovarian cyst, unspecified side: Secondary | ICD-10-CM | POA: Insufficient documentation

## 2013-02-01 DIAGNOSIS — N8502 Endometrial intraepithelial neoplasia [EIN]: Secondary | ICD-10-CM

## 2013-02-01 NOTE — Patient Instructions (Addendum)
Surgery will be scheduled for 02/19/2013.  You will receive a phone call from pre-surgical testing at [facility] to arrange for a pre-op appointment.

## 2013-02-01 NOTE — Progress Notes (Signed)
Consult Note: Gyn-Onc   Brandi Warren 53 y.o. female  Chief Complaint  Patient presents with  . Endometrial hyperplasia with atypia    Assessment : Grade 1 endometrial adenocarcinoma.  Plan:  We'll proceed with a total nominal hysterectomy bilateral salpingo-oophorectomy and possible pelvic and aortic lymphadenectomy on 02/19/2013.  The surgical procedure and risks were reviewed. Patient's questions are answered. The need for postoperative radiation therapy or chemotherapy was mentioned and the patient understands this is based on final pathology report.   Interval history: Following my initial visit with the patient on 11/16/2012, she underwent a D&C on 01/25/2013. Final pathology showed a well-differentiated adenocarcinoma (grade 1) the patient's had an uncomplicated postoperative course. She has minimal vaginal discharge at the present time and is not actively bleeding. She denies any pelvic pain.  History of present illness: 53 year old white female seen in consultation at the request of Dr. 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 The patient has had abnormal bleeding off and on for last several years. This initially treated with Megace but apparently the bleeding continued. Subsequently the patient has been placed on Provera which seems to have reduce the amount of bleeding considerably. She has minimal lower abdominal and pelvic discomfort. A pelvic ultrasound was also revealed a 4 cm cyst with a septation in the left ovary. The patient is being a history of having ovarian cyst removed at laparoscopy many years ago.  Review of Systems:10 point review of systems is negative except as noted in interval history.   Vitals: Blood pressure 113/82, pulse 83, temperature 98.6 F (37 C), temperature source Oral, resp. rate 16, weight 138 lb 6.4 oz (62.778 kg), last menstrual period 01/23/2013.  Physical Exam: General : The patient is a  healthy woman in no acute distress.  HEENT: normocephalic, extraoccular movements normal; neck is supple without thyromegally  Lynphnodes: Supraclavicular and inguinal nodes not enlarged  Abdomen: Soft, non-tender, no ascites, no organomegally, no masses, no hernias  Pelvic:  EGBUS: Normal female  Vagina: Normal, no lesions  Urethra and Bladder: Normal, non-tender  Cervix: Normal Uterus: Anterior normal shape size and consistency Bi-manual examination: Non-tender; no adenxal masses or nodularity  Rectal: normal sphincter tone, no masses, no blood  Lower extremities: No edema or varicosities. Normal range of motion      Allergies  Allergen Reactions  . Sulfa Antibiotics Other (See Comments)    seizures    Past Medical History  Diagnosis Date  . Seizures   . DDD (degenerative disc disease), cervical   . Ovarian cyst   . Heart murmur     history of heart murmur    Past Surgical History  Procedure Laterality Date  . Cervical fusion  1986 at [facility]  . Cholecystectomy    . Right arm orif    . Ovarian cyst removal    . Dilation and curettage of uterus N/A 01/25/2013    Procedure: DILATATION AND CURETTAGE;  Surgeon: Mora Bellman, MD;  Location: [facility];  Service: Gynecology;  Laterality: N/A;    Current Outpatient Prescriptions  Medication Sig Dispense Refill  . ibuprofen (ADVIL,MOTRIN) 600 MG tablet Take 1 tablet (600 mg total) by mouth every 6 (six) hours as needed.  30 tablet  1  . medroxyPROGESTERone (PROVERA) 10 MG tablet Take 1 tablet (10 mg total) by mouth daily.  30 tablet  12  . oxyCODONE-acetaminophen (PERCOCET/ROXICET) 5-325 MG per tablet Take 1-2 tablets by mouth every 4 (four) hours as needed.  20 tablet  0  . phenytoin (DILANTIN) 100 MG ER capsule TAKE TWO CAPSULES BY MOUTH ONCE DAILY  60 capsule  1   No current facility-administered medications for this visit.    History   Social History  . Marital Status: Divorced    Spouse Name: N/A    Number of  Children: N/A  . Years of Education: N/A   Occupational History  . Not on file.   Social History Main Topics  . Smoking status: Former Smoker    Quit date: 10/02/1992  . Smokeless tobacco: Never Used  . Alcohol Use: No  . Drug Use: No  . Sexual Activity: Yes   Other Topics Concern  . Not on file   Social History Narrative  . No narrative on file    No family history on file.    Alvino Chapel, MD 02/01/2013, 9:24 AM

## 2013-02-06 ENCOUNTER — Telehealth: Payer: Self-pay | Admitting: *Deleted

## 2013-02-06 DIAGNOSIS — R110 Nausea: Secondary | ICD-10-CM

## 2013-02-06 MED ORDER — PROMETHAZINE HCL 25 MG PO TABS: 25.0000 mg | ORAL_TABLET | Freq: Four times a day (QID) | ORAL | Status: DC | PRN

## 2013-02-06 NOTE — Telephone Encounter (Signed)
Malikah called and left a message she is a patient of Dr. Elly Modena and had a D&C 2 weeks ago , and has a hysterectomy planned for  02/19/13 with Dr. Loletta Specter Dianah Field. Wants to know is she can have something for nausea- states they told her she could have something.   Called Dr. Elly Modena and may give patient choice of phenergan or zofran. Called [facility] and she states in recovery room they told her they would send her home with a prescription for nausea, but they didin't. States she didn't have nausea then , but is now. We discussed nausea is not a usual side effect of a D&C, but we discussed maybe she has been exposed to a gi bug in the community.  I informed her she could have choice of phenergan and zofran- she chose phenergan as that is what they offered. We discussed this may make you sleepy. Prescription sent to her pharmacy.

## 2013-02-11 ENCOUNTER — Telehealth: Payer: Self-pay | Admitting: *Deleted

## 2013-02-11 NOTE — Telephone Encounter (Signed)
Call to pt with reminder clear liquids 24hrs give enema evening prior to surgery, npo after midnight. Pt verbalized understanding. No concerns at this time.

## 2013-02-13 ENCOUNTER — Encounter (HOSPITAL_COMMUNITY): Payer: Self-pay | Admitting: Pharmacy Technician

## 2013-02-15 ENCOUNTER — Encounter (HOSPITAL_COMMUNITY): Payer: Self-pay

## 2013-02-15 ENCOUNTER — Encounter (HOSPITAL_COMMUNITY)
Admission: RE | Admit: 2013-02-15 | Discharge: 2013-02-15 | Disposition: A | Discharge status: Home / Self Care | Payer: 59 | Source: Ambulatory Visit | Attending: Gynecology | Admitting: Gynecology

## 2013-02-15 ENCOUNTER — Ambulatory Visit (HOSPITAL_COMMUNITY)
Admission: RE | Admit: 2013-02-15 | Discharge: 2013-02-15 | Disposition: A | Discharge status: Home / Self Care | Payer: 59 | Source: Ambulatory Visit | Attending: Gynecology | Admitting: Gynecology

## 2013-02-15 DIAGNOSIS — I10 Essential (primary) hypertension: Secondary | ICD-10-CM | POA: Insufficient documentation

## 2013-02-15 DIAGNOSIS — Z01810 Encounter for preprocedural cardiovascular examination: Secondary | ICD-10-CM | POA: Insufficient documentation

## 2013-02-15 DIAGNOSIS — Z01812 Encounter for preprocedural laboratory examination: Secondary | ICD-10-CM | POA: Insufficient documentation

## 2013-02-15 DIAGNOSIS — C549 Malignant neoplasm of corpus uteri, unspecified: Secondary | ICD-10-CM | POA: Insufficient documentation

## 2013-02-15 DIAGNOSIS — Z01818 Encounter for other preprocedural examination: Secondary | ICD-10-CM | POA: Insufficient documentation

## 2013-02-15 HISTORY — DX: Other complications of anesthesia, initial encounter: T88.59XA

## 2013-02-15 HISTORY — DX: Adverse effect of unspecified anesthetic, initial encounter: T41.45XA

## 2013-02-15 LAB — CBC WITH DIFFERENTIAL/PLATELET
Basophils Absolute: 0 10*3/uL (ref 0.0–0.1)
Basophils Relative: 0 % (ref 0–1)
EOS ABS: 0 10*3/uL (ref 0.0–0.7)
EOS PCT: 0 % (ref 0–5)
HCT: 39.3 % (ref 36.0–46.0)
Hemoglobin: 13.1 g/dL (ref 12.0–15.0)
LYMPHS ABS: 1.6 10*3/uL (ref 0.7–4.0)
Lymphocytes Relative: 32 % (ref 12–46)
MCH: 31 pg (ref 26.0–34.0)
MCHC: 33.3 g/dL (ref 30.0–36.0)
MCV: 92.9 fL (ref 78.0–100.0)
Monocytes Absolute: 0.3 10*3/uL (ref 0.1–1.0)
Monocytes Relative: 6 % (ref 3–12)
Neutro Abs: 3 10*3/uL (ref 1.7–7.7)
Neutrophils Relative %: 61 % (ref 43–77)
Platelets: 231 10*3/uL (ref 150–400)
RBC: 4.23 MIL/uL (ref 3.87–5.11)
RDW: 12.6 % (ref 11.5–15.5)
WBC: 4.8 10*3/uL (ref 4.0–10.5)

## 2013-02-15 LAB — URINALYSIS, ROUTINE W REFLEX MICROSCOPIC
BILIRUBIN URINE: NEGATIVE
Glucose, UA: NEGATIVE mg/dL
Hgb urine dipstick: NEGATIVE
KETONES UR: NEGATIVE mg/dL
Leukocytes, UA: NEGATIVE
NITRITE: NEGATIVE
PROTEIN: NEGATIVE mg/dL
Specific Gravity, Urine: 1.013 (ref 1.005–1.030)
UROBILINOGEN UA: 0.2 mg/dL (ref 0.0–1.0)
pH: 7 (ref 5.0–8.0)

## 2013-02-15 LAB — COMPREHENSIVE METABOLIC PANEL
ALT: 10 U/L (ref 0–35)
AST: 15 U/L (ref 0–37)
Albumin: 3.8 g/dL (ref 3.5–5.2)
Alkaline Phosphatase: 42 U/L (ref 39–117)
BUN: 12 mg/dL (ref 6–23)
CALCIUM: 8.9 mg/dL (ref 8.4–10.5)
CO2: 26 meq/L (ref 19–32)
CREATININE: 0.72 mg/dL (ref 0.50–1.10)
Chloride: 101 mEq/L (ref 96–112)
GLUCOSE: 95 mg/dL (ref 70–99)
Potassium: 4.2 mEq/L (ref 3.7–5.3)
Sodium: 137 mEq/L (ref 137–147)
Total Bilirubin: 0.3 mg/dL (ref 0.3–1.2)
Total Protein: 7.1 g/dL (ref 6.0–8.3)

## 2013-02-15 LAB — ABO/RH: ABO/RH(D): A POS

## 2013-02-15 LAB — HCG, SERUM, QUALITATIVE: PREG SERUM: NEGATIVE

## 2013-02-15 NOTE — Pre-Procedure Instructions (Signed)
EKG AND CXR WERE DONE TODAY - PREOP AT [facility]. 

## 2013-02-15 NOTE — Patient Instructions (Addendum)
PER [place] FLU POLICY  - NO VISITORS UNDER 51 YEARS OF AGE.  CLEAR LIQUID DIET ALL DAY - THE DAY BEFORE YOUR SURGERY - SEE LIST OF CLEAR LIQUIDS GIVEN WITH THESE INSTRUCTIONS. ENEMA NIGHT BEFORE SURGERY - AS PER INSTRUCTIONS DR. CLARKE-PEARSON'S OFFICE.   YOUR SURGERY IS SCHEDULED AT [facility]  ON:  Tuesday  2/3  REPORT TO  SHORT STAY CENTER AT:  6:30 AM      PHONE # FOR SHORT STAY IS [phone]  DO NOT EAT OR DRINK ANYTHING AFTER MIDNIGHT THE NIGHT BEFORE YOUR SURGERY.  YOU MAY BRUSH YOUR TEETH, RINSE OUT YOUR MOUTH--BUT NO WATER, NO FOOD, NO CHEWING GUM, NO MINTS, NO CANDIES, NO CHEWING TOBACCO.  PLEASE TAKE THE FOLLOWING MEDICATIONS THE AM OF YOUR SURGERY WITH A FEW SIPS OF WATER:  PHENYTOIN    DO NOT BRING VALUABLES, MONEY, CREDIT CARDS.  DO NOT WEAR JEWELRY, MAKE-UP, NAIL POLISH AND NO METAL PINS OR CLIPS IN YOUR HAIR. CONTACT LENS, DENTURES / PARTIALS, GLASSES SHOULD NOT BE WORN TO SURGERY AND IN MOST CASES-HEARING AIDS WILL NEED TO BE REMOVED.  BRING YOUR GLASSES CASE, ANY EQUIPMENT NEEDED FOR YOUR CONTACT LENS. FOR PATIENTS ADMITTED TO THE HOSPITAL--CHECK OUT TIME THE DAY OF DISCHARGE IS 11:00 AM.  ALL INPATIENT ROOMS ARE PRIVATE - WITH BATHROOM, TELEPHONE, TELEVISION AND WIFI INTERNET.                                                    PLEASE READ OVER ANY  FACT SHEETS THAT YOU WERE GIVEN: BLOOD TRANSFUSION INFORMATION, INCENTIVE SPIROMETER INFORMATION. AFTER YOUR SURGERY - REMEMBER TO DEEP BREATHE, COUGH, TURN / CHANGE POSITIONS OFTEN WHEN IN BED, DO LEG EXERCISES - TO HELP PREVENT BLOOD CLOTS AND LUNG COMPLICATIONS.  FAILURE TO FOLLOW THESE INSTRUCTIONS MAY RESULT IN THE CANCELLATION OF YOUR SURGERY. PLEASE BE AWARE THAT YOU MAY NEED ADDITIONAL BLOOD DRAWN DAY OF YOUR SURGERY  PATIENT SIGNATURE_________________________________

## 2013-02-18 ENCOUNTER — Telehealth: Payer: Self-pay | Admitting: *Deleted

## 2013-02-18 NOTE — Telephone Encounter (Signed)
Call to pt with reminder to have clear liquids only today and NPO after Midnight. Pt verbalized understanding. No further concerns

## 2013-02-19 ENCOUNTER — Encounter (HOSPITAL_COMMUNITY): Payer: Self-pay | Admitting: *Deleted

## 2013-02-19 ENCOUNTER — Encounter (HOSPITAL_COMMUNITY): Payer: 59 | Admitting: Anesthesiology

## 2013-02-19 ENCOUNTER — Inpatient Hospital Stay (HOSPITAL_COMMUNITY): Payer: 59 | Admitting: Anesthesiology

## 2013-02-19 ENCOUNTER — Encounter (HOSPITAL_COMMUNITY)
Admission: RE | Disposition: A | Discharge status: Home / Self Care | Payer: Self-pay | Source: Ambulatory Visit | Attending: Obstetrics & Gynecology

## 2013-02-19 ENCOUNTER — Inpatient Hospital Stay (HOSPITAL_COMMUNITY)
Admission: RE | Admit: 2013-02-19 | Discharge: 2013-02-21 | DRG: 741 | Disposition: A | Discharge status: Home / Self Care | Payer: 59 | Source: Ambulatory Visit | Attending: Obstetrics & Gynecology | Admitting: Obstetrics & Gynecology

## 2013-02-19 DIAGNOSIS — Z981 Arthrodesis status: Secondary | ICD-10-CM

## 2013-02-19 DIAGNOSIS — C549 Malignant neoplasm of corpus uteri, unspecified: Principal | ICD-10-CM | POA: Diagnosis present

## 2013-02-19 DIAGNOSIS — N8500 Endometrial hyperplasia, unspecified: Secondary | ICD-10-CM | POA: Insufficient documentation

## 2013-02-19 DIAGNOSIS — D259 Leiomyoma of uterus, unspecified: Secondary | ICD-10-CM | POA: Diagnosis present

## 2013-02-19 DIAGNOSIS — N83209 Unspecified ovarian cyst, unspecified side: Secondary | ICD-10-CM | POA: Diagnosis present

## 2013-02-19 DIAGNOSIS — Z79899 Other long term (current) drug therapy: Secondary | ICD-10-CM

## 2013-02-19 DIAGNOSIS — C541 Malignant neoplasm of endometrium: Secondary | ICD-10-CM | POA: Diagnosis present

## 2013-02-19 DIAGNOSIS — M5030 Other cervical disc degeneration, unspecified cervical region: Secondary | ICD-10-CM | POA: Diagnosis present

## 2013-02-19 DIAGNOSIS — R569 Unspecified convulsions: Secondary | ICD-10-CM | POA: Diagnosis present

## 2013-02-19 DIAGNOSIS — R011 Cardiac murmur, unspecified: Secondary | ICD-10-CM | POA: Diagnosis present

## 2013-02-19 HISTORY — PX: ABDOMINAL HYSTERECTOMY: SHX81

## 2013-02-19 LAB — TYPE AND SCREEN
ABO/RH(D): A POS
ANTIBODY SCREEN: NEGATIVE

## 2013-02-19 SURGERY — HYSTERECTOMY, ABDOMINAL
Anesthesia: General | Site: Abdomen | Laterality: Bilateral

## 2013-02-19 MED ORDER — SODIUM CHLORIDE 0.9 % IJ SOLN
INTRAMUSCULAR | Status: DC | PRN
  Administered 2013-02-19: 10:00:00

## 2013-02-19 MED ORDER — MIDAZOLAM HCL 5 MG/5ML IJ SOLN
INTRAMUSCULAR | Status: DC | PRN
  Administered 2013-02-19 (×2): 2 mg via INTRAVENOUS

## 2013-02-19 MED ORDER — KETOROLAC TROMETHAMINE 30 MG/ML IJ SOLN
30.0000 mg | Freq: Four times a day (QID) | INTRAMUSCULAR | Status: AC
  Administered 2013-02-19 – 2013-02-20 (×2): 30 mg via INTRAVENOUS
  Filled 2013-02-19 (×2): qty 1

## 2013-02-19 MED ORDER — GLYCOPYRROLATE 0.2 MG/ML IJ SOLN
INTRAMUSCULAR | Status: DC | PRN
  Administered 2013-02-19: .2 mg via INTRAVENOUS

## 2013-02-19 MED ORDER — LACTATED RINGERS IV SOLN
INTRAVENOUS | Status: DC | PRN
  Administered 2013-02-19 (×3): via INTRAVENOUS

## 2013-02-19 MED ORDER — ACETAMINOPHEN 10 MG/ML IV SOLN
1000.0000 mg | Freq: Once | INTRAVENOUS | Status: AC
  Administered 2013-02-19: 1000 mg via INTRAVENOUS
  Filled 2013-02-19: qty 100

## 2013-02-19 MED ORDER — HYDROMORPHONE HCL PF 1 MG/ML IJ SOLN
0.5000 mg | INTRAMUSCULAR | Status: DC | PRN
  Administered 2013-02-19 – 2013-02-20 (×4): 1 mg via INTRAVENOUS
  Filled 2013-02-19 (×4): qty 1

## 2013-02-19 MED ORDER — LIDOCAINE HCL (CARDIAC) 20 MG/ML IV SOLN
INTRAVENOUS | Status: AC
  Filled 2013-02-19: qty 5

## 2013-02-19 MED ORDER — HYDROMORPHONE HCL PF 1 MG/ML IJ SOLN
0.2500 mg | INTRAMUSCULAR | Status: DC | PRN
  Administered 2013-02-19 (×4): 0.5 mg via INTRAVENOUS

## 2013-02-19 MED ORDER — ONDANSETRON HCL 4 MG/2ML IJ SOLN
INTRAMUSCULAR | Status: DC | PRN
  Administered 2013-02-19 (×2): 2 mg via INTRAVENOUS

## 2013-02-19 MED ORDER — PROPOFOL 10 MG/ML IV BOLUS
INTRAVENOUS | Status: AC
  Filled 2013-02-19: qty 20

## 2013-02-19 MED ORDER — SUFENTANIL CITRATE 50 MCG/ML IV SOLN
INTRAVENOUS | Status: DC | PRN
Start: 2013-02-19 — End: 2013-02-19
  Administered 2013-02-19 (×5): 10 ug via INTRAVENOUS

## 2013-02-19 MED ORDER — MAGNESIUM HYDROXIDE 400 MG/5ML PO SUSP
30.0000 mL | Freq: Three times a day (TID) | ORAL | Status: AC
  Administered 2013-02-19 – 2013-02-20 (×3): 30 mL via ORAL
  Filled 2013-02-19 (×3): qty 30

## 2013-02-19 MED ORDER — MIDAZOLAM HCL 2 MG/2ML IJ SOLN
INTRAMUSCULAR | Status: AC
  Filled 2013-02-19: qty 2

## 2013-02-19 MED ORDER — PROPOFOL 10 MG/ML IV BOLUS
INTRAVENOUS | Status: DC | PRN
  Administered 2013-02-19: 175 mg via INTRAVENOUS

## 2013-02-19 MED ORDER — HYDROMORPHONE HCL PF 1 MG/ML IJ SOLN
INTRAMUSCULAR | Status: AC
  Filled 2013-02-19: qty 1

## 2013-02-19 MED ORDER — ACETAMINOPHEN 500 MG PO TABS
1000.0000 mg | ORAL_TABLET | Freq: Four times a day (QID) | ORAL | Status: DC
  Administered 2013-02-19 – 2013-02-20 (×5): 1000 mg via ORAL
  Filled 2013-02-19 (×11): qty 2

## 2013-02-19 MED ORDER — NEOSTIGMINE METHYLSULFATE 1 MG/ML IJ SOLN
INTRAMUSCULAR | Status: AC
  Filled 2013-02-19: qty 10

## 2013-02-19 MED ORDER — CEFAZOLIN SODIUM-DEXTROSE 2-3 GM-% IV SOLR
INTRAVENOUS | Status: DC | PRN
  Administered 2013-02-19: 2 g via INTRAVENOUS

## 2013-02-19 MED ORDER — FENTANYL CITRATE 0.05 MG/ML IJ SOLN
INTRAMUSCULAR | Status: AC
  Filled 2013-02-19: qty 5

## 2013-02-19 MED ORDER — PHENYTOIN SODIUM EXTENDED 100 MG PO CAPS
200.0000 mg | ORAL_CAPSULE | Freq: Every day | ORAL | Status: DC
  Administered 2013-02-20 – 2013-02-21 (×2): 200 mg via ORAL
  Filled 2013-02-19 (×2): qty 2

## 2013-02-19 MED ORDER — ENOXAPARIN SODIUM 40 MG/0.4ML ~~LOC~~ SOLN
40.0000 mg | SUBCUTANEOUS | Status: AC
Start: 2013-02-19 — End: 2013-02-19
  Administered 2013-02-19: 40 mg via SUBCUTANEOUS
  Filled 2013-02-19: qty 0.4

## 2013-02-19 MED ORDER — SUCCINYLCHOLINE CHLORIDE 20 MG/ML IJ SOLN
INTRAMUSCULAR | Status: DC | PRN
  Administered 2013-02-19: 100 mg via INTRAVENOUS

## 2013-02-19 MED ORDER — CEFAZOLIN SODIUM-DEXTROSE 2-3 GM-% IV SOLR
INTRAVENOUS | Status: AC
  Filled 2013-02-19: qty 50

## 2013-02-19 MED ORDER — KETOROLAC TROMETHAMINE 30 MG/ML IJ SOLN: 30.0000 mg | Freq: Four times a day (QID) | INTRAMUSCULAR | Status: AC

## 2013-02-19 MED ORDER — FENTANYL CITRATE 0.05 MG/ML IJ SOLN
INTRAMUSCULAR | Status: DC | PRN
  Administered 2013-02-19 (×3): 50 ug via INTRAVENOUS
  Administered 2013-02-19: 100 ug via INTRAVENOUS

## 2013-02-19 MED ORDER — NEOSTIGMINE METHYLSULFATE 1 MG/ML IJ SOLN
INTRAMUSCULAR | Status: DC | PRN
  Administered 2013-02-19: 1 mg via INTRAVENOUS

## 2013-02-19 MED ORDER — BUPIVACAINE LIPOSOME 1.3 % IJ SUSP
20.0000 mL | Freq: Once | INTRAMUSCULAR | Status: DC
  Filled 2013-02-19: qty 20

## 2013-02-19 MED ORDER — ONDANSETRON HCL 4 MG PO TABS
4.0000 mg | ORAL_TABLET | Freq: Four times a day (QID) | ORAL | Status: DC | PRN
  Administered 2013-02-21: 4 mg via ORAL
  Filled 2013-02-19: qty 1

## 2013-02-19 MED ORDER — CISATRACURIUM BESYLATE 20 MG/10ML IV SOLN
INTRAVENOUS | Status: AC
  Filled 2013-02-19: qty 10

## 2013-02-19 MED ORDER — SUFENTANIL CITRATE 50 MCG/ML IV SOLN: INTRAVENOUS | Status: DC | PRN

## 2013-02-19 MED ORDER — KCL IN DEXTROSE-NACL 20-5-0.45 MEQ/L-%-% IV SOLN
INTRAVENOUS | Status: DC
  Administered 2013-02-19 – 2013-02-20 (×2): via INTRAVENOUS
  Filled 2013-02-19 (×3): qty 1000

## 2013-02-19 MED ORDER — ENSURE COMPLETE PO LIQD: 237.0000 mL | Freq: Two times a day (BID) | ORAL | Status: DC

## 2013-02-19 MED ORDER — ZOLPIDEM TARTRATE 5 MG PO TABS: 5.0000 mg | ORAL_TABLET | Freq: Every evening | ORAL | Status: DC | PRN

## 2013-02-19 MED ORDER — CISATRACURIUM BESYLATE (PF) 10 MG/5ML IV SOLN
INTRAVENOUS | Status: DC | PRN
  Administered 2013-02-19: 8 mg via INTRAVENOUS

## 2013-02-19 MED ORDER — KETOROLAC TROMETHAMINE 30 MG/ML IJ SOLN: 15.0000 mg | Freq: Four times a day (QID) | INTRAMUSCULAR | Status: DC

## 2013-02-19 MED ORDER — CEFAZOLIN SODIUM-DEXTROSE 2-3 GM-% IV SOLR
2.0000 g | INTRAVENOUS | Status: AC
  Administered 2013-02-19: 2 g via INTRAVENOUS

## 2013-02-19 MED ORDER — KETOROLAC TROMETHAMINE 30 MG/ML IJ SOLN
15.0000 mg | Freq: Four times a day (QID) | INTRAMUSCULAR | Status: DC
Start: 2013-02-19 — End: 2013-02-19
  Administered 2013-02-19: 15 mg via INTRAVENOUS
  Filled 2013-02-19: qty 1

## 2013-02-19 MED ORDER — ONDANSETRON HCL 4 MG/2ML IJ SOLN
4.0000 mg | Freq: Four times a day (QID) | INTRAMUSCULAR | Status: DC | PRN
  Administered 2013-02-19: 4 mg via INTRAVENOUS
  Filled 2013-02-19: qty 2

## 2013-02-19 MED ORDER — SUFENTANIL CITRATE 50 MCG/ML IV SOLN
INTRAVENOUS | Status: AC
  Filled 2013-02-19: qty 1

## 2013-02-19 MED ORDER — DEXAMETHASONE SODIUM PHOSPHATE 10 MG/ML IJ SOLN
INTRAMUSCULAR | Status: DC | PRN
  Administered 2013-02-19: 10 mg via INTRAVENOUS

## 2013-02-19 MED ORDER — SODIUM CHLORIDE 0.9 % IJ SOLN
INTRAMUSCULAR | Status: AC
  Filled 2013-02-19: qty 50

## 2013-02-19 MED ORDER — GLYCOPYRROLATE 0.2 MG/ML IJ SOLN
INTRAMUSCULAR | Status: AC
  Filled 2013-02-19: qty 3

## 2013-02-19 MED ORDER — PROMETHAZINE HCL 25 MG/ML IJ SOLN: 6.2500 mg | INTRAMUSCULAR | Status: DC | PRN

## 2013-02-19 MED ORDER — OXYCODONE HCL 5 MG PO TABS
10.0000 mg | ORAL_TABLET | ORAL | Status: DC | PRN
  Administered 2013-02-19 – 2013-02-21 (×7): 10 mg via ORAL
  Filled 2013-02-19 (×7): qty 2

## 2013-02-19 MED ORDER — LACTATED RINGERS IV SOLN: INTRAVENOUS | Status: DC

## 2013-02-19 MED ORDER — ONDANSETRON HCL 4 MG/2ML IJ SOLN
INTRAMUSCULAR | Status: AC
  Filled 2013-02-19: qty 2

## 2013-02-19 MED ORDER — LIDOCAINE HCL (CARDIAC) 20 MG/ML IV SOLN
INTRAVENOUS | Status: DC | PRN
  Administered 2013-02-19: 75 mg via INTRAVENOUS

## 2013-02-19 MED ORDER — DEXAMETHASONE SODIUM PHOSPHATE 10 MG/ML IJ SOLN
INTRAMUSCULAR | Status: AC
Start: 2013-02-19 — End: 2013-02-19
  Filled 2013-02-19: qty 1

## 2013-02-19 SURGICAL SUPPLY — 34 items
ATTRACTOMAT 16X20 MAGNETIC DRP (DRAPES) ×2 IMPLANT
BLADE EXTENDED COATED 6.5IN (ELECTRODE) ×2 IMPLANT
CANISTER SUCTION 2500CC (MISCELLANEOUS) ×2 IMPLANT
CHLORAPREP W/TINT 26ML (MISCELLANEOUS) ×2 IMPLANT
CLIP TI MEDIUM LARGE 6 (CLIP) ×2 IMPLANT
CONT SPEC 4OZ CLIKSEAL STRL BL (MISCELLANEOUS) ×2 IMPLANT
COVER SURGICAL LIGHT HANDLE (MISCELLANEOUS) IMPLANT
DRAPE UTILITY W/TAPE 26X15 (DRAPES) ×2 IMPLANT
DRAPE WARM FLUID 44X44 (DRAPE) ×2 IMPLANT
ELECT REM PT RETURN 9FT ADLT (ELECTROSURGICAL) ×2
ELECTRODE REM PT RTRN 9FT ADLT (ELECTROSURGICAL) ×1 IMPLANT
GAUZE SPONGE 4X4 16PLY XRAY LF (GAUZE/BANDAGES/DRESSINGS) IMPLANT
GLOVE BIO SURGEON STRL SZ 6.5 (GLOVE) ×3 IMPLANT
GLOVE BIOGEL M STRL SZ7.5 (GLOVE) ×8 IMPLANT
GOWN STRL REUS W/TWL LRG LVL3 (GOWN DISPOSABLE) ×4 IMPLANT
GOWN STRL REUS W/TWL XL LVL3 (GOWN DISPOSABLE) ×2 IMPLANT
KIT BASIN OR (CUSTOM PROCEDURE TRAY) ×2 IMPLANT
NS IRRIG 1000ML POUR BTL (IV SOLUTION) ×6 IMPLANT
PACK GENERAL/GYN (CUSTOM PROCEDURE TRAY) ×2 IMPLANT
SHEET LAVH (DRAPES) ×2 IMPLANT
SPONGE GAUZE 4X4 12PLY (GAUZE/BANDAGES/DRESSINGS) ×2 IMPLANT
SPONGE LAP 18X18 X RAY DECT (DISPOSABLE) ×2 IMPLANT
STAPLER VISISTAT 35W (STAPLE) ×2 IMPLANT
SUT MNCRL AB 4-0 PS2 18 (SUTURE) ×1 IMPLANT
SUT PDS AB 1 CTXB1 36 (SUTURE) ×4 IMPLANT
SUT VIC AB 0 CT1 36 (SUTURE) ×6 IMPLANT
SUT VIC AB 2-0 CT2 27 (SUTURE) ×13 IMPLANT
SUT VIC AB 2-0 SH 27 (SUTURE) ×4
SUT VIC AB 2-0 SH 27X BRD (SUTURE) ×2 IMPLANT
SUT VICRYL 2 0 18  UND BR (SUTURE) ×1
SUT VICRYL 2 0 18 UND BR (SUTURE) ×1 IMPLANT
TOWEL OR 17X26 10 PK STRL BLUE (TOWEL DISPOSABLE) ×2 IMPLANT
TOWEL OR NON WOVEN STRL DISP B (DISPOSABLE) ×2 IMPLANT
TRAY FOLEY CATH 14FRSI W/METER (CATHETERS) ×2 IMPLANT

## 2013-02-19 NOTE — H&P (View-Only) (Signed)
Consult Note: Gyn-Onc   Brandi Warren 53 y.o. female  Chief Complaint  Patient presents with  . Endometrial hyperplasia with atypia    Assessment : Grade 1 endometrial adenocarcinoma.  Plan:  We'll proceed with a total nominal hysterectomy bilateral salpingo-oophorectomy and possible pelvic and aortic lymphadenectomy on 02/19/2013.  The surgical procedure and risks were reviewed. Patient's questions are answered. The need for postoperative radiation therapy or chemotherapy was mentioned and the patient understands this is based on final pathology report.   Interval history: Following my initial visit with the patient on 11/16/2012, she underwent a D&C on 01/25/2013. Final pathology showed a well-differentiated adenocarcinoma (grade 1) the patient's had an uncomplicated postoperative course. She has minimal vaginal discharge at the present time and is not actively bleeding. She denies any pelvic pain.  History of present illness: 53 year old white female seen in consultation at the request of Dr. 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 The patient has had abnormal bleeding off and on for last several years. This initially treated with Megace but apparently the bleeding continued. Subsequently the patient has been placed on Provera which seems to have reduce the amount of bleeding considerably. She has minimal lower abdominal and pelvic discomfort. A pelvic ultrasound was also revealed a 4 cm cyst with a septation in the left ovary. The patient is being a history of having ovarian cyst removed at laparoscopy many years ago.  Review of Systems:10 point review of systems is negative except as noted in interval history.   Vitals: Blood pressure 113/82, pulse 83, temperature 98.6 F (37 C), temperature source Oral, resp. rate 16, weight 138 lb 6.4 oz (62.778 kg), last menstrual period 01/23/2013.  Physical Exam: General : The patient is a  healthy woman in no acute distress.  HEENT: normocephalic, extraoccular movements normal; neck is supple without thyromegally  Lynphnodes: Supraclavicular and inguinal nodes not enlarged  Abdomen: Soft, non-tender, no ascites, no organomegally, no masses, no hernias  Pelvic:  EGBUS: Normal female  Vagina: Normal, no lesions  Urethra and Bladder: Normal, non-tender  Cervix: Normal Uterus: Anterior normal shape size and consistency Bi-manual examination: Non-tender; no adenxal masses or nodularity  Rectal: normal sphincter tone, no masses, no blood  Lower extremities: No edema or varicosities. Normal range of motion      Allergies  Allergen Reactions  . Sulfa Antibiotics Other (See Comments)    seizures    Past Medical History  Diagnosis Date  . Seizures   . DDD (degenerative disc disease), cervical   . Ovarian cyst   . Heart murmur     history of heart murmur    Past Surgical History  Procedure Laterality Date  . Cervical fusion  1986 at [facility]  . Cholecystectomy    . Right arm orif    . Ovarian cyst removal    . Dilation and curettage of uterus N/A 01/25/2013    Procedure: DILATATION AND CURETTAGE;  Surgeon: Mora Bellman, MD;  Location: [facility];  Service: Gynecology;  Laterality: N/A;    Current Outpatient Prescriptions  Medication Sig Dispense Refill  . ibuprofen (ADVIL,MOTRIN) 600 MG tablet Take 1 tablet (600 mg total) by mouth every 6 (six) hours as needed.  30 tablet  1  . medroxyPROGESTERone (PROVERA) 10 MG tablet Take 1 tablet (10 mg total) by mouth daily.  30 tablet  12  . oxyCODONE-acetaminophen (PERCOCET/ROXICET) 5-325 MG per tablet Take 1-2 tablets by mouth every 4 (four) hours as needed.  20 tablet  0  . phenytoin (DILANTIN) 100 MG ER capsule TAKE TWO CAPSULES BY MOUTH ONCE DAILY  60 capsule  1   No current facility-administered medications for this visit.    History   Social History  . Marital Status: Divorced    Spouse Name: N/A    Number of  Children: N/A  . Years of Education: N/A   Occupational History  . Not on file.   Social History Main Topics  . Smoking status: Former Smoker    Quit date: 10/02/1992  . Smokeless tobacco: Never Used  . Alcohol Use: No  . Drug Use: No  . Sexual Activity: Yes   Other Topics Concern  . Not on file   Social History Narrative  . No narrative on file    No family history on file.    CLARKE-PEARSON,Jenney Brester L, MD 02/01/2013, 9:24 AM        

## 2013-02-19 NOTE — Interval H&P Note (Signed)
History and Physical Interval Note:  02/19/2013 8:19 AM  Brandi Warren  has presented today for surgery, with the diagnosis of ENDOMETRIAL CANCER   The various methods of treatment have been discussed with the patient and family. After consideration of risks, benefits and other options for treatment, the patient has consented to  Procedure(s): EXPLORATORY LAPAROTOMY TOTAL ABDOMINAL HYSTERECTOMY BILATERAL SALPINGOOPHORECTOMY POSSIBLE PELVIC AND AORTIC LYMPHADENECTOMY  (Bilateral) as a surgical intervention .  The patient's history has been reviewed, patient examined, no change in status, stable for surgery.  I have reviewed the patient's chart and labs.  Questions were answered to the patient's satisfaction.     CLARKE-PEARSON,Kaleb Sek L

## 2013-02-19 NOTE — Progress Notes (Signed)
PACU note----Spoke with Dr. Winfred Leeds, anesthes, re; pt's cardiac rhythm; ordered telemetry monitoring postop

## 2013-02-19 NOTE — Anesthesia Procedure Notes (Signed)
Procedure Name: Intubation Date/Time: 02/19/2013 9:00 AM Performed by: Ofilia Neas Pre-anesthesia Checklist: Patient identified, Patient being monitored, Timeout performed, Emergency Drugs available and Suction available Patient Re-evaluated:Patient Re-evaluated prior to inductionOxygen Delivery Method: Circle system utilized Preoxygenation: Pre-oxygenation with 100% oxygen (head/neck in comfortable position per pt.) Intubation Type: IV induction and Cricoid Pressure applied Ventilation: Mask ventilation without difficulty Laryngoscope Size: Mac and 3 Grade View: Grade II Tube type: Oral Tube size: 7.5 mm Number of attempts: 1 Airway Equipment and Method: Stylet Placement Confirmation: ETT inserted through vocal cords under direct vision,  positive ETCO2 and breath sounds checked- equal and bilateral Secured at: 21 cm Tube secured with: Tape Dental Injury: Teeth and Oropharynx as per pre-operative assessment  Difficulty Due To: Difficulty was anticipated, Difficult Airway- due to reduced neck mobility, Difficult Airway- due to limited oral opening and Difficult Airway- due to anterior larynx Future Recommendations: Recommend- induction with short-acting agent, and alternative techniques readily available

## 2013-02-19 NOTE — Op Note (Signed)
VARNIKA BUTZ  female MEDICAL RECORD [id number] DATE OF BIRTH: July 21, 1960 PHYSICIAN: Marti Sleigh, M.D  02/19/2013   OPERATIVE REPORT  PREOPERATIVE DIAGNOSIS: Grade 1 endometrial carcinoma, left ovarian cyst  POSTOPERATIVE DIAGNOSIS: Same (minimal myometrial invasion on frozen section)  PROCEDURE: Total abdominal hysterectomy, bilateral salpingo-oophorectomy  SURGEON: Marti Sleigh, M.D ASSISTANT: Lahoma Crocker M.D. ANESTHESIA: Gen. with oral tracheal tube ESTIMATED BLOOD LOSS: 50 mL  SURGICAL FINDINGS: At time of exploratory laparotomy the uterus appeared normal with a small uterine fibroid. The appendix was normal there were no enlarged lymph nodes. There is a benign appearing cyst arising from the left ovary measuring approximately 3 cm in diameter. On frozen section there was no evidence of deep myometrial invasion. In fact no obvious cancer to be identified.  PROCEDURE: The patient was brought to the operating room and after satisfactory attainment of general anesthesia was placed in a modified lithotomy position in [place]. The anterior abdominal wall, perineum and vagina were prepped, Foley catheter was inserted, the patient was draped. A time-out was taken, SCDs were in place, prophylactic antibiotics were administered.  The abdomen was entered through a Pfannenstiel incision. Peritoneal washings were taken and sent to cytopathology.   Bookwalter retractor was assembled and  the small bowel and sigmoid colon were elevated out of the pelvis thus exposing the uterus. The uterus was grasped with two long Kelly clamps.  The right round ligament was divided the retroperitoneal spaces opened. The iliac vessels and ureter were identified. The ovarian vessels were skeletonized clamped, cut, free tied and suture ligated. Similar procedures performed left side the pelvis. The bladder flap was advanced with sharp and blunt dissection. Uterine vessels were  skeletonized then clamped cut and suture ligated. In the paracervical and cardinal ligaments were clamped, cut and suture ligated. Vaginal angles were encountered, crossclamped and the vagina transected from its connection to the cervix. The uterus, cervix,tubes and ovaries were handed off the operative field. The vaginal angles were transfixed with 0 Vicryl the central portion of vagina closed with interrupted figure-of-eight sutures of 0 Vicryl. The pelvis was irrigated and hemostasis was ascertained.  The retractor was taken down   The abdomen and pelvis were irrigated.  The anterior abdominal wall was closed in layers. Peritoneum was reapproximated with 2-0 Vicryl. The fascia was closed in a running #1 PDS suture.. Subcutaneous tissue was irrigated and hemostasis achieved with cautery. Experal (266 mg diluted in 40 ml saline)was injected into the subcutaneous layer. Skin was closed with a running subcuticular suture of 4-0 Monocryl.. A dressing was applied.  Patient was awakened from anesthesia and taken to the recovery room in satisfactory condition. Sponge needle and instrument counts correct times two.   Marti Sleigh, M.D

## 2013-02-19 NOTE — Transfer of Care (Signed)
Immediate Anesthesia Transfer of Care Note  Patient: Brandi Warren  Procedure(s) Performed: Procedure(s): EXPLORATORY LAPAROTOMY TOTAL ABDOMINAL HYSTERECTOMY BILATERAL SALPINGOOPHORECTOMY  (Bilateral)  Patient Location: PACU  Anesthesia Type:General  Level of Consciousness: awake, alert , oriented and patient cooperative  Airway & Oxygen Therapy: Patient Spontanous Breathing and Patient connected to face mask oxygen  Post-op Assessment: Report given to PACU RN, Post -op Vital signs reviewed and stable and Patient moving all extremities  Post vital signs: Reviewed  Complications: No apparent anesthesia complications

## 2013-02-19 NOTE — Preoperative (Signed)
Beta Blockers   Reason not to administer Beta Blockers:Not Applicable 

## 2013-02-19 NOTE — Anesthesia Preprocedure Evaluation (Addendum)
Anesthesia Evaluation  Patient identified by MRN, date of birth, ID band Patient awake    Reviewed: Allergy & Precautions, H&P , NPO status , Patient's Chart, lab work & pertinent test results, reviewed documented beta blocker date and time   History of Anesthesia Complications Negative for: history of anesthetic complications  Airway Mallampati: II TM Distance: >3 FB Neck ROM: Limited    Dental  (+) Teeth Intact, Dental Advisory Given, Caps and Missing,    Pulmonary neg pulmonary ROS, former smoker,  breath sounds clear to auscultation        Cardiovascular Exercise Tolerance: Good negative cardio ROS  Rhythm:regular Rate:Normal     Neuro/Psych Seizures -, Well Controlled,   Neuromuscular disease negative neurological ROS  negative psych ROS   GI/Hepatic negative GI ROS, Neg liver ROS,   Endo/Other  negative endocrine ROS  Renal/GU negative Renal ROS  negative genitourinary   Musculoskeletal negative musculoskeletal ROS (+)   Abdominal   Peds  Hematology negative hematology ROS (+)   Anesthesia Other Findings DDD (degenerative disc disease), cervical        Ovarian cyst     Heart murmur  Limited ROM Cspine post fusion  Reproductive/Obstetrics negative OB ROS                          Anesthesia Physical Anesthesia Plan  ASA: II  Anesthesia Plan: General   Post-op Pain Management:    Induction: Intravenous  Airway Management Planned: Oral ETT  Additional Equipment:   Intra-op Plan:   Post-operative Plan: Extubation in OR  Informed Consent: I have reviewed the patients History and Physical, chart, labs and discussed the procedure including the risks, benefits and alternatives for the proposed anesthesia with the patient or authorized representative who has indicated his/her understanding and acceptance.   Dental advisory given  Plan Discussed with: CRNA  Anesthesia Plan  Comments:         Anesthesia Quick Evaluation

## 2013-02-20 ENCOUNTER — Encounter (HOSPITAL_COMMUNITY): Payer: Self-pay | Admitting: Gynecology

## 2013-02-20 LAB — BASIC METABOLIC PANEL
BUN: 7 mg/dL (ref 6–23)
CALCIUM: 8.1 mg/dL — AB (ref 8.4–10.5)
CO2: 28 mEq/L (ref 19–32)
Chloride: 98 mEq/L (ref 96–112)
Creatinine, Ser: 0.64 mg/dL (ref 0.50–1.10)
GFR calc Af Amer: >90 mL/min (ref >90)
GFR calc non Af Amer: >90 mL/min (ref >90)
Glucose, Bld: 140 mg/dL — ABNORMAL HIGH (ref 70–99)
Potassium: 4.1 mEq/L (ref 3.7–5.3)
Sodium: 134 mEq/L — ABNORMAL LOW (ref 137–147)

## 2013-02-20 LAB — CBC
HCT: 31 % — ABNORMAL LOW (ref 36.0–46.0)
Hemoglobin: 10.3 g/dL — ABNORMAL LOW (ref 12.0–15.0)
MCH: 30.8 pg (ref 26.0–34.0)
MCHC: 33.2 g/dL (ref 30.0–36.0)
MCV: 92.8 fL (ref 78.0–100.0)
PLATELETS: 185 10*3/uL (ref 150–400)
RBC: 3.34 MIL/uL — ABNORMAL LOW (ref 3.87–5.11)
RDW: 12.6 % (ref 11.5–15.5)
WBC: 10.1 10*3/uL (ref 4.0–10.5)

## 2013-02-20 MED ORDER — IBUPROFEN 600 MG PO TABS
600.0000 mg | ORAL_TABLET | Freq: Four times a day (QID) | ORAL | Status: DC
  Administered 2013-02-20 – 2013-02-21 (×4): 600 mg via ORAL
  Filled 2013-02-20 (×7): qty 1

## 2013-02-20 NOTE — Progress Notes (Signed)
D/c Foley catheter at 0600 this am. Patient tolerated well. Stated she thought she could void. Tried to void, with a few drops of urine in the hat as a result. Assisted back to bed. Verbalized understanding to call nursing staff whenever she needs to try to void again. Also, removed abdominal dressing this am. Scant amount of old drainage on dressing. Steri strips in place. Incision is clean, dry and intact. Will continue to monitor closely. Blanchard Kelch, RN

## 2013-02-20 NOTE — Anesthesia Postprocedure Evaluation (Signed)
Anesthesia Post Note  Patient: Brandi Warren  Procedure(s) Performed: Procedure(s) (LRB): EXPLORATORY LAPAROTOMY TOTAL ABDOMINAL HYSTERECTOMY BILATERAL SALPINGOOPHORECTOMY  (Bilateral)  Anesthesia type: General  Patient location: PACU  Post pain: Pain level controlled  Post assessment: Post-op Vital signs reviewed  Last Vitals:  Filed Vitals:   02/20/13 0550  BP: 94/52  Pulse: 78  Temp: 36.4 C  Resp: 18    Post vital signs: Reviewed  Level of consciousness: sedated  Complications: No apparent anesthesia complications

## 2013-02-20 NOTE — Progress Notes (Signed)
1 Day Post-Op Procedure(s) (LRB): EXPLORATORY LAPAROTOMY TOTAL ABDOMINAL HYSTERECTOMY BILATERAL SALPINGOOPHORECTOMY  (Bilateral)  Subjective: Patient reports doing well this am.  Mild nausea yesterday evening that has resolved.  Tolerated solid food this am.  Ambulating with assist.  Asking about discharge.  Denies chest pain, dyspnea, passing flatus, or having a bowel movement.  No concerns voiced.  Objective: Vital signs in last 24 hours: Temp:  [97.5 F (36.4 C)-98.4 F (36.9 C)] 97.5 F (36.4 C) (02/04 0550) Pulse Rate:  [36-100] 78 (02/04 0550) Resp:  [12-20] 18 (02/04 0550) BP: (94-146)/(52-89) 94/52 mmHg (02/04 0550) SpO2:  [96 %-100 %] 98 % (02/04 0550) Weight:  [147 lb 7.8 oz (66.9 kg)] 147 lb 7.8 oz (66.9 kg) (02/03 1230) Last BM Date: 02/18/13  Intake/Output from previous day: 02/03 0701 - 02/04 0700 In: 3403.8 [P.O.:360; I.V.:3043.8] Out: 1950 [Urine:1900; Blood:50]  Physical Examination: General: alert, cooperative and no distress Resp: clear to auscultation bilaterally Cardio: regular rate and rhythm and regular at this time, pt reporting only Hx of heart murmur, irregular at times per telemetry strips GI: soft, non-tender; bowel sounds normal; no masses,  no organomegaly and incision: low transverse incision with steri strips clean, dry, and intact.  No drainage or erythema noted Extremities: extremities normal, atraumatic, no cyanosis or edema  Labs: WBC/Hgb/Hct/Plts:  10.1/10.3/31.0/185 (02/04 0402) BUN/Cr/glu/ALT/AST/amyl/lip:  7/0.64/--/--/--/--/-- (02/04 0402)  Assessment: 53 y.o. s/p Procedure(s): EXPLORATORY LAPAROTOMY TOTAL ABDOMINAL HYSTERECTOMY BILATERAL SALPINGOOPHORECTOMY : stable Pain:  Pain is well-controlled on PRN medications.  Heme:  Hgb 10.3 Hct 31.0 this am.  CV:  On telemetry.  Hx heart murmur.  Irregular rhythm at times.  GI:  Tolerating po: Yes.     GU: Adequate output recorded.  FEN:  Stable post-op.  Prophylaxis: intermittent  pneumatic compression boots.  Plan: Saline lock IV PO pain medications Encourage ambulation, IS use, deep breathing, and coughing Continue post-operative plan of care per Dr. Delsa Sale Plan for possible discharge as soon as tomorrow if +BM or flatus   LOS: 1 day    Ajla Mcgeachy DEAL 02/20/2013, 10:39 AM

## 2013-02-21 MED ORDER — OXYCODONE-ACETAMINOPHEN 5-325 MG PO TABS: 1.0000 | ORAL_TABLET | ORAL | Status: DC | PRN

## 2013-02-21 MED ORDER — BISACODYL 10 MG RE SUPP
10.0000 mg | Freq: Once | RECTAL | Status: AC
  Administered 2013-02-21: 10 mg via RECTAL
  Filled 2013-02-21: qty 1

## 2013-02-21 NOTE — Discharge Instructions (Signed)
02/21/2013  Return to work: 2-6 weeks  Activity: 1. Be up and out of the bed during the day.  Take a nap if needed.  You may walk up steps but be careful and use the hand rail.  Stair climbing will tire you more than you think, you may need to stop part way and rest.   2. No lifting or straining for 6 weeks.  3. No driving for 2 weeks.  Do not drive if you are taking narcotic pain medicine.  4. Shower daily.  Use soap and water on your incision and pat dry; don't rub.   5. No sexual activity and nothing in the vagina for 6 weeks.  Diet: 1. Low sodium Heart Healthy Diet is recommended.  2. It is safe to use a laxative if you have difficulty moving your bowels.   Wound Care: 1. Keep clean and dry.  Shower daily.  Reasons to call the Doctor:  Fever - Oral temperature greater than 100.4 degrees Fahrenheit  Foul-smelling vaginal discharge  Difficulty urinating  Nausea and vomiting  Increased pain at the site of the incision that is unrelieved with pain medicine.  Difficulty breathing with or without chest pain  New calf pain especially if only on one side  Sudden, continuing increased vaginal bleeding with or without clots.   Contacts: For questions or concerns you should contact:  Dr. Lahoma Crocker at [phone]  Dr. Fermin Schwab at [place]  Joylene John, NP at [phone]  Acetaminophen; Oxycodone tablets What is this medicine? ACETAMINOPHEN; OXYCODONE (a set a MEE noe fen; ox i KOE done) is a pain reliever. It is used to treat mild to moderate pain. This medicine may be used for other purposes; ask your health care provider or pharmacist if you have questions. COMMON BRAND NAME(S): Endocet, Magnacet, Narvox, Percocet, Perloxx, Primalev, Primlev, Roxicet, Xolox What should I tell my health care provider before I take this medicine? They need to know if you have any of these conditions: -brain tumor -Crohn's disease, inflammatory bowel  disease, or ulcerative colitis -drug abuse or addiction -head injury -heart or circulation problems -if you often drink alcohol -kidney disease or problems going to the bathroom -liver disease -lung disease, asthma, or breathing problems -an unusual or allergic reaction to acetaminophen, oxycodone, other opioid analgesics, other medicines, foods, dyes, or preservatives -pregnant or trying to get pregnant -breast-feeding How should I use this medicine? Take this medicine by mouth with a full glass of water. Follow the directions on the prescription label. Take your medicine at regular intervals. Do not take your medicine more often than directed. Talk to your pediatrician regarding the use of this medicine in children. Special care may be needed. Patients over 26 years old may have a stronger reaction and need a smaller dose. Overdosage: If you think you have taken too much of this medicine contact a poison control center or emergency room at once. NOTE: This medicine is only for you. Do not share this medicine with others. What if I miss a dose? If you miss a dose, take it as soon as you can. If it is almost time for your next dose, take only that dose. Do not take double or extra doses. What may interact with this medicine? -alcohol -antihistamines -barbiturates like amobarbital, butalbital, butabarbital, methohexital, pentobarbital, phenobarbital, thiopental, and secobarbital -benztropine -drugs for bladder problems like solifenacin, trospium, oxybutynin, tolterodine, hyoscyamine, and methscopolamine -drugs for breathing problems like ipratropium and tiotropium -drugs for certain stomach or intestine problems  like propantheline, homatropine methylbromide, glycopyrrolate, atropine, belladonna, and dicyclomine -general anesthetics like etomidate, ketamine, nitrous oxide, propofol, desflurane, enflurane, halothane, isoflurane, and sevoflurane -medicines for depression, anxiety, or  psychotic disturbances -medicines for sleep -muscle relaxants -naltrexone -narcotic medicines (opiates) for pain -phenothiazines like perphenazine, thioridazine, chlorpromazine, mesoridazine, fluphenazine, prochlorperazine, promazine, and trifluoperazine -scopolamine -tramadol -trihexyphenidyl This list may not describe all possible interactions. Give your health care provider a list of all the medicines, herbs, non-prescription drugs, or dietary supplements you use. Also tell them if you smoke, drink alcohol, or use illegal drugs. Some items may interact with your medicine. What should I watch for while using this medicine? Tell your doctor or health care professional if your pain does not go away, if it gets worse, or if you have new or a different type of pain. You may develop tolerance to the medicine. Tolerance means that you will need a higher dose of the medication for pain relief. Tolerance is normal and is expected if you take this medicine for a long time. Do not suddenly stop taking your medicine because you may develop a severe reaction. Your body becomes used to the medicine. This does NOT mean you are addicted. Addiction is a behavior related to getting and using a drug for a non-medical reason. If you have pain, you have a medical reason to take pain medicine. Your doctor will tell you how much medicine to take. If your doctor wants you to stop the medicine, the dose will be slowly lowered over time to avoid any side effects. You may get drowsy or dizzy. Do not drive, use machinery, or do anything that needs mental alertness until you know how this medicine affects you. Do not stand or sit up quickly, especially if you are an older patient. This reduces the risk of dizzy or fainting spells. Alcohol may interfere with the effect of this medicine. Avoid alcoholic drinks. There are different types of narcotic medicines (opiates) for pain. If you take more than one type at the same time, you  may have more side effects. Give your health care provider a list of all medicines you use. Your doctor will tell you how much medicine to take. Do not take more medicine than directed. Call emergency for help if you have problems breathing. The medicine will cause constipation. Try to have a bowel movement at least every 2 to 3 days. If you do not have a bowel movement for 3 days, call your doctor or health care professional. Do not take Tylenol (acetaminophen) or medicines that have acetaminophen with this medicine. Too much acetaminophen can be very dangerous. Many nonprescription medicines contain acetaminophen. Always read the labels carefully to avoid taking more acetaminophen. What side effects may I notice from receiving this medicine? Side effects that you should report to your doctor or health care professional as soon as possible: -allergic reactions like skin rash, itching or hives, swelling of the face, lips, or tongue -breathing difficulties, wheezing -confusion -light headedness or fainting spells -severe stomach pain -unusually weak or tired -yellowing of the skin or the whites of the eyes  Side effects that usually do not require medical attention (report to your doctor or health care professional if they continue or are bothersome): -dizziness -drowsiness -nausea -vomiting This list may not describe all possible side effects. Call your doctor for medical advice about side effects. You may report side effects to FDA at 1-800-FDA-1088. Where should I keep my medicine? Keep out of the reach of children. This  medicine can be abused. Keep your medicine in a safe place to protect it from theft. Do not share this medicine with anyone. Selling or giving away this medicine is dangerous and against the law. Store at room temperature between 20 and 25 degrees C (68 and 77 degrees F). Keep container tightly closed. Protect from light. This medicine may cause accidental overdose and death  if it is taken by other adults, children, or pets. Flush any unused medicine down the toilet to reduce the chance of harm. Do not use the medicine after the expiration date. NOTE: This sheet is a summary. It may not cover all possible information. If you have questions about this medicine, talk to your doctor, pharmacist, or health care provider.  2014, [employer]vier/Gold Standard. (2012-08-27 13:17:35)

## 2013-02-21 NOTE — Discharge Summary (Signed)
Physician Discharge Summary  Patient ID: EVYNN BOUTELLE MRN: [id number] DOB/AGE: 1960-07-08 53 y.o.  Admit date: 02/19/2013 Discharge date: 02/21/2013  Admission Diagnoses: Endometrial cancer  Discharge Diagnoses:  Principal Problem:   Endometrial cancer  Discharged Condition:  The patient is in good condition and stable for discharge.    Hospital Course: On 02/19/2013, the patient underwent the following: Procedure(s): EXPLORATORY LAPAROTOMY TOTAL ABDOMINAL HYSTERECTOMY BILATERAL SALPINGOOPHORECTOMY .  The postoperative course was uneventful.  She was discharged to home on postoperative day 2 tolerating a regular diet and having bowel movements.  Consults: None  Significant Diagnostic Studies: None  Treatments: surgery: see above  Discharge Exam: Blood pressure 97/57, pulse 82, temperature 98.4 F (36.9 C), temperature source Oral, resp. rate 20, height 5\' 5"  (1.651 m), weight 147 lb 7.8 oz (66.9 kg), last menstrual period 01/23/2013, SpO2 97.00%. General appearance: alert, cooperative and no distress Resp: clear to auscultation bilaterally Cardio: regular rate and rhythm, S1, S2 normal, no murmur, click, rub or gallop GI: soft, non-tender; bowel sounds normal; no masses,  no organomegaly and slightly distended, tympanic on percussion Extremities: extremities normal, atraumatic, no cyanosis or edema Incision/Wound: Low transverse incision with steri strips clean, dry, and intact  Disposition: 01-Home or Self Care      Discharge Orders   Future Appointments Provider Department Dept Phone   03/15/2013 4:00 PM Alvino Chapel, MD [facility] [facility] [phone]   Future Orders Complete By Expires   Call MD for:  difficulty breathing, headache or visual disturbances  As directed    Call MD for:  extreme fatigue  As directed    Call MD for:  hives  As directed    Call MD for:  persistant dizziness or light-headedness  As directed    Call MD  for:  persistant nausea and vomiting  As directed    Call MD for:  redness, tenderness, or signs of infection (pain, swelling, redness, odor or green/yellow discharge around incision site)  As directed    Call MD for:  severe uncontrolled pain  As directed    Call MD for:  temperature >100.4  As directed    Diet - low sodium heart healthy  As directed    Driving Restrictions  As directed    Comments:     No driving for 2 weeks.  Do not take narcotics and drive.   Increase activity slowly  As directed    Lifting restrictions  As directed    Comments:     No lifting greater than 10 lbs.   Sexual Activity Restrictions  As directed    Comments:     No sexual activity, nothing in the vagina, for 6 weeks.       Medication List    STOP taking these medications       medroxyPROGESTERone 10 MG tablet  Commonly known as:  PROVERA      TAKE these medications       ibuprofen 600 MG tablet  Commonly known as:  ADVIL,MOTRIN  Take 600 mg by mouth every 6 (six) hours as needed for headache or moderate pain.     oxyCODONE-acetaminophen 5-325 MG per tablet  Commonly known as:  PERCOCET/ROXICET  Take 1-2 tablets by mouth every 4 (four) hours as needed for severe pain.     phenytoin 100 MG ER capsule  Commonly known as:  DILANTIN  Take 200 mg by mouth daily. PT USUALLY TAKES ABOUT 1:00 PM - STATES IT MAKES  HER GROGGY IF SHE TAKES IN AM.     promethazine 25 MG tablet  Commonly known as:  PHENERGAN  Take 25 mg by mouth every 6 (six) hours as needed for nausea or vomiting.       Follow-up Information   Follow up with Alvino Chapel, MD On 03/15/2013. (at 4:00pm with arrival time of 3:30pm at the [facility])    Specialty:  Gynecology   Contact information:   [place] [place] [address] [phone]       Greater than thirty minutes were spend for face to face discharge instructions and discharge orders/summary in EPIC.   Signed: Jahmar Mckelvy DEAL 02/21/2013,  9:06 AM

## 2013-02-21 NOTE — Progress Notes (Signed)
Discussed discharge instructions with patient, removed IV, removed tele box, and written prescription given to patient.  Patient did not have any questions or concerns and feeling well enough to go home.

## 2013-02-26 ENCOUNTER — Other Ambulatory Visit: Payer: Self-pay | Admitting: Family Medicine

## 2013-02-26 ENCOUNTER — Ambulatory Visit: Payer: Self-pay | Admitting: Gynecologic Oncology

## 2013-02-26 ENCOUNTER — Telehealth: Payer: Self-pay | Admitting: Gynecologic Oncology

## 2013-02-26 ENCOUNTER — Telehealth: Payer: Self-pay | Admitting: *Deleted

## 2013-02-26 DIAGNOSIS — Z1231 Encounter for screening mammogram for malignant neoplasm of breast: Secondary | ICD-10-CM

## 2013-02-26 NOTE — Telephone Encounter (Signed)
Pt called requesting a mammogram appointment.

## 2013-02-26 NOTE — Telephone Encounter (Signed)
Post op telephone call to check patient status.  Patient describes expected post operative status.  Adequate PO intake reported.  Bowels and bladder functioning without difficulty.  Pain minimal.  Reportable signs and symptoms reviewed.  Follow up appt arranged.   

## 2013-02-28 ENCOUNTER — Encounter: Payer: Self-pay | Admitting: Gynecologic Oncology

## 2013-03-06 NOTE — Telephone Encounter (Signed)
Scheduled mammogram for February 23rd @ 230pm.  Called pt and informed pt of her above appt time for her mammogram.  I advised pt if she needs to reschedule her appt I gave [phone].  I asked pt if she has had a pap smear.  Pt stated that she has a total hysterectomy.  I advised to continue to make appts as needed.  Pt stated understanding.

## 2013-03-11 ENCOUNTER — Ambulatory Visit (HOSPITAL_COMMUNITY): Payer: 59

## 2013-03-15 ENCOUNTER — Ambulatory Visit: Payer: 59 | Attending: Gynecology | Admitting: Gynecology

## 2013-03-15 ENCOUNTER — Encounter: Payer: Self-pay | Admitting: Gynecology

## 2013-03-15 VITALS — BP 119/76 | HR 83 | Temp 98.1°F | Wt 133.2 lb

## 2013-03-15 DIAGNOSIS — Z888 Allergy status to other drugs, medicaments and biological substances status: Secondary | ICD-10-CM | POA: Insufficient documentation

## 2013-03-15 DIAGNOSIS — C541 Malignant neoplasm of endometrium: Secondary | ICD-10-CM

## 2013-03-15 DIAGNOSIS — Z90710 Acquired absence of both cervix and uterus: Secondary | ICD-10-CM | POA: Insufficient documentation

## 2013-03-15 DIAGNOSIS — IMO0002 Reserved for concepts with insufficient information to code with codable children: Secondary | ICD-10-CM | POA: Insufficient documentation

## 2013-03-15 DIAGNOSIS — Z79899 Other long term (current) drug therapy: Secondary | ICD-10-CM | POA: Insufficient documentation

## 2013-03-15 DIAGNOSIS — R569 Unspecified convulsions: Secondary | ICD-10-CM | POA: Insufficient documentation

## 2013-03-15 DIAGNOSIS — C549 Malignant neoplasm of corpus uteri, unspecified: Secondary | ICD-10-CM | POA: Insufficient documentation

## 2013-03-15 NOTE — Patient Instructions (Addendum)
We will contact you with an appointment to see Dr. Marko Plume, Medical Oncologist, for three cycles of chemotherapy.  Plan to follow up with Dr. Fermin Schwab in April or sooner if needed.
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
## 2013-03-15 NOTE — Addendum Note (Signed)
Addended by: Joylene John D on: 03/15/2013 03:33 PM   Modules accepted: Orders

## 2013-03-18 ENCOUNTER — Telehealth: Payer: Self-pay | Admitting: Oncology

## 2013-03-18 NOTE — Telephone Encounter (Signed)
S/W PT AND GAVE NEW PATIENT FOR 03/16 @ 3 W/DR/LIVESAY.  CALENDAR MAILED.

## 2013-03-19 ENCOUNTER — Telehealth: Payer: Self-pay | Admitting: Oncology

## 2013-03-19 NOTE — Telephone Encounter (Signed)
C/D 03/19/13 for appt. 04/01/13

## 2013-03-22 ENCOUNTER — Encounter: Payer: Self-pay | Admitting: Obstetrics & Gynecology

## 2013-03-26 ENCOUNTER — Other Ambulatory Visit: Payer: Self-pay | Admitting: Oncology

## 2013-03-26 ENCOUNTER — Ambulatory Visit (HOSPITAL_COMMUNITY): Payer: 59

## 2013-03-26 DIAGNOSIS — C541 Malignant neoplasm of endometrium: Secondary | ICD-10-CM

## 2013-03-27 ENCOUNTER — Telehealth: Payer: Self-pay | Admitting: *Deleted

## 2013-03-27 ENCOUNTER — Other Ambulatory Visit: Payer: Self-pay | Admitting: *Deleted

## 2013-03-27 ENCOUNTER — Encounter: Payer: Self-pay | Admitting: Obstetrics

## 2013-03-27 ENCOUNTER — Other Ambulatory Visit: Payer: Self-pay | Admitting: Gynecologic Oncology

## 2013-03-27 NOTE — Telephone Encounter (Signed)
Call to pt to schedule Chemo education class. Pt to see Dr. Marko Plume 3/16 with Chemo class prior to office visit. Spoke with Alleta who will see pt for 1 on 1 teaching at 3pm Thursday. Pt verbalized and confirmed appt for chemo education class. Per Dr. Fermin Schwab last office note, recommendation for Taxol/carboplatin treatment

## 2013-03-28 ENCOUNTER — Encounter: Payer: Self-pay | Admitting: *Deleted

## 2013-03-28 ENCOUNTER — Encounter: Payer: Self-pay | Admitting: Oncology

## 2013-03-28 ENCOUNTER — Other Ambulatory Visit: Payer: 59

## 2013-03-28 NOTE — Progress Notes (Signed)
We'll arrange the patient be seen by Dr. Marko Plume and schedule initiation of chemotherapy. Up on seeing the patient back following completion of her chemotherapy. She given the okay return to full levels of activity in 2 weeks. Notes from dr.

## 2013-03-28 NOTE — Progress Notes (Signed)
As of today no date for treatment. °

## 2013-03-29 ENCOUNTER — Other Ambulatory Visit: Payer: Self-pay

## 2013-04-01 ENCOUNTER — Ambulatory Visit (HOSPITAL_BASED_OUTPATIENT_CLINIC_OR_DEPARTMENT_OTHER): Payer: 59

## 2013-04-01 ENCOUNTER — Ambulatory Visit (HOSPITAL_BASED_OUTPATIENT_CLINIC_OR_DEPARTMENT_OTHER): Payer: 59 | Admitting: Oncology

## 2013-04-01 ENCOUNTER — Encounter: Payer: Self-pay | Admitting: Oncology

## 2013-04-01 VITALS — BP 119/65 | HR 78 | Temp 97.1°F | Resp 18 | Ht 65.0 in | Wt 133.0 lb

## 2013-04-01 DIAGNOSIS — C549 Malignant neoplasm of corpus uteri, unspecified: Secondary | ICD-10-CM

## 2013-04-01 DIAGNOSIS — C541 Malignant neoplasm of endometrium: Secondary | ICD-10-CM

## 2013-04-01 LAB — COMPREHENSIVE METABOLIC PANEL (CC13)
ALBUMIN: 4 g/dL (ref 3.5–5.0)
ALK PHOS: 57 U/L (ref 40–150)
ALT: 14 U/L (ref 0–55)
AST: 16 U/L (ref 5–34)
Anion Gap: 9 mEq/L (ref 3–11)
BUN: 12.5 mg/dL (ref 7.0–26.0)
CO2: 27 mEq/L (ref 22–29)
Calcium: 9.5 mg/dL (ref 8.4–10.4)
Chloride: 105 mEq/L (ref 98–109)
Creatinine: 0.7 mg/dL (ref 0.6–1.1)
Glucose: 93 mg/dl (ref 70–140)
POTASSIUM: 3.9 meq/L (ref 3.5–5.1)
SODIUM: 141 meq/L (ref 136–145)
Total Bilirubin: <0.2 mg/dL (ref 0.20–1.20)
Total Protein: 7.3 g/dL (ref 6.4–8.3)

## 2013-04-01 LAB — CBC WITH DIFFERENTIAL/PLATELET
BASO%: 0.6 % (ref 0.0–2.0)
BASOS ABS: 0 10*3/uL (ref 0.0–0.1)
EOS%: 0 % (ref 0.0–7.0)
Eosinophils Absolute: 0 10*3/uL (ref 0.0–0.5)
HEMATOCRIT: 37.9 % (ref 34.8–46.6)
HEMOGLOBIN: 12.6 g/dL (ref 11.6–15.9)
LYMPH#: 2.5 10*3/uL (ref 0.9–3.3)
LYMPH%: 37.2 % (ref 14.0–49.7)
MCH: 30.6 pg (ref 25.1–34.0)
MCHC: 33.2 g/dL (ref 31.5–36.0)
MCV: 92 fL (ref 79.5–101.0)
MONO#: 0.4 10*3/uL (ref 0.1–0.9)
MONO%: 6 % (ref 0.0–14.0)
NEUT%: 56.2 % (ref 38.4–76.8)
NEUTROS ABS: 3.8 10*3/uL (ref 1.5–6.5)
PLATELETS: 224 10*3/uL (ref 145–400)
RBC: 4.12 10*6/uL (ref 3.70–5.45)
RDW: 12.9 % (ref 11.2–14.5)
WBC: 6.8 10*3/uL (ref 3.9–10.3)
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
## 2013-04-01 NOTE — Progress Notes (Signed)
Checked in new patient with no financial issues she has appt card.

## 2013-04-02 ENCOUNTER — Telehealth: Payer: Self-pay | Admitting: *Deleted

## 2013-04-02 NOTE — Telephone Encounter (Signed)
Pt called in for her appts. gv appt for 04/12/13 w/ labs@ 8am and ov@ 8:30am. i also gv appt for per pt request because she needs all Friday appts 05/10/13 w/ labs@ 2:30pm and ov@ 3pm. Pt is aware that tx will be added i emailed MW to add the tx....td

## 2013-04-02 NOTE — Telephone Encounter (Signed)
Per patient and POF I have scheduled appts. Patient aware

## 2013-04-03 ENCOUNTER — Telehealth: Payer: Self-pay

## 2013-04-03 DIAGNOSIS — C541 Malignant neoplasm of endometrium: Secondary | ICD-10-CM

## 2013-04-03 MED ORDER — LORAZEPAM 0.5 MG PO TABS: 0.5000 mg | ORAL_TABLET | Freq: Four times a day (QID) | ORAL | Status: DC | PRN

## 2013-04-03 MED ORDER — DEXAMETHASONE 4 MG PO TABS: ORAL_TABLET | ORAL | Status: DC

## 2013-04-03 MED ORDER — ONDANSETRON HCL 8 MG PO TABS: ORAL_TABLET | ORAL | Status: DC

## 2013-04-03 NOTE — Telephone Encounter (Signed)
Brandi Warren called staing that the antiemeitics and steroid premedications were not at her pharmacy. Will call in ativan and electronically send in decadron and zofran.  Reviewed when to take decadron pre med 04-04-13 at 1200 midnight and 0600 on 04-05-13 with food.  Pt. Verbalized understanding. Brandi Warren stated that she has a small abrasion between her recturm and vagina.     Afebrile Suggested she take some sitz bath which Brandi Warren stated would be fine as she is 6 weeks post-op. She may have nicked the area while wiping after using the rest room.  Her finger nails are long.  She will be cutting them today. Told Brandi Warren to let Dr. Marko Plume know if the srea is not healing or she is running a temperature >101.5.

## 2013-04-05 ENCOUNTER — Encounter: Payer: Self-pay | Admitting: Oncology

## 2013-04-05 ENCOUNTER — Telehealth: Payer: Self-pay

## 2013-04-05 ENCOUNTER — Ambulatory Visit (HOSPITAL_BASED_OUTPATIENT_CLINIC_OR_DEPARTMENT_OTHER): Payer: 59

## 2013-04-05 VITALS — BP 133/64 | HR 87 | Temp 98.1°F | Resp 16

## 2013-04-05 DIAGNOSIS — C50919 Malignant neoplasm of unspecified site of unspecified female breast: Secondary | ICD-10-CM

## 2013-04-05 DIAGNOSIS — C541 Malignant neoplasm of endometrium: Secondary | ICD-10-CM

## 2013-04-05 DIAGNOSIS — C549 Malignant neoplasm of corpus uteri, unspecified: Secondary | ICD-10-CM

## 2013-04-05 DIAGNOSIS — C779 Secondary and unspecified malignant neoplasm of lymph node, unspecified: Secondary | ICD-10-CM

## 2013-04-05 DIAGNOSIS — Z5111 Encounter for antineoplastic chemotherapy: Secondary | ICD-10-CM

## 2013-04-05 MED ORDER — SODIUM CHLORIDE 0.9 % IV SOLN: 525.5000 mg | Freq: Once | INTRAVENOUS | Status: DC

## 2013-04-05 MED ORDER — DEXAMETHASONE SODIUM PHOSPHATE 20 MG/5ML IJ SOLN
20.0000 mg | Freq: Once | INTRAMUSCULAR | Status: AC
  Administered 2013-04-05: 20 mg via INTRAVENOUS

## 2013-04-05 MED ORDER — SODIUM CHLORIDE 0.9 % IV SOLN
175.0000 mg/m2 | Freq: Once | INTRAVENOUS | Status: AC
  Administered 2013-04-05: 288 mg via INTRAVENOUS
  Filled 2013-04-05: qty 48

## 2013-04-05 MED ORDER — LORAZEPAM 1 MG PO TABS
0.5000 mg | ORAL_TABLET | Freq: Once | ORAL | Status: AC
  Administered 2013-04-05: 0.5 mg via ORAL

## 2013-04-05 MED ORDER — ONDANSETRON 16 MG/50ML IVPB (CHCC)
INTRAVENOUS | Status: AC
  Filled 2013-04-05: qty 16

## 2013-04-05 MED ORDER — DIPHENHYDRAMINE HCL 50 MG/ML IJ SOLN
INTRAMUSCULAR | Status: AC
  Filled 2013-04-05: qty 1

## 2013-04-05 MED ORDER — ONDANSETRON 16 MG/50ML IVPB (CHCC)
16.0000 mg | Freq: Once | INTRAVENOUS | Status: AC
  Administered 2013-04-05: 16 mg via INTRAVENOUS

## 2013-04-05 MED ORDER — FAMOTIDINE IN NACL 20-0.9 MG/50ML-% IV SOLN
20.0000 mg | Freq: Once | INTRAVENOUS | Status: AC
  Administered 2013-04-05: 20 mg via INTRAVENOUS

## 2013-04-05 MED ORDER — SODIUM CHLORIDE 0.9 % IV SOLN
580.0000 mg | Freq: Once | INTRAVENOUS | Status: AC
  Administered 2013-04-05: 580 mg via INTRAVENOUS
  Filled 2013-04-05: qty 58

## 2013-04-05 MED ORDER — FAMOTIDINE IN NACL 20-0.9 MG/50ML-% IV SOLN
INTRAVENOUS | Status: AC
  Filled 2013-04-05: qty 50

## 2013-04-05 MED ORDER — SODIUM CHLORIDE 0.9 % IV SOLN
Freq: Once | INTRAVENOUS | Status: AC
  Administered 2013-04-05: 13:00:00 via INTRAVENOUS

## 2013-04-05 MED ORDER — DEXAMETHASONE SODIUM PHOSPHATE 20 MG/5ML IJ SOLN
INTRAMUSCULAR | Status: AC
  Filled 2013-04-05: qty 5

## 2013-04-05 MED ORDER — DIPHENHYDRAMINE HCL 50 MG/ML IJ SOLN
50.0000 mg | Freq: Once | INTRAMUSCULAR | Status: AC
  Administered 2013-04-05: 50 mg via INTRAVENOUS

## 2013-04-05 MED ORDER — LORAZEPAM 1 MG PO TABS
ORAL_TABLET | ORAL | Status: AC
  Filled 2013-04-05: qty 1

## 2013-04-05 NOTE — Progress Notes (Signed)
Reviewed after hours calling procedure if patient has any problems.  Verbalized understanding.

## 2013-04-05 NOTE — Progress Notes (Signed)
First time taxol.  Pt experiencing restlessness.  0.5mg  ativan given per Dr. Marko Plume.

## 2013-04-05 NOTE — Telephone Encounter (Signed)
Message copied by Baruch Merl on Fri Apr 05, 2013  6:29 PM ------      Message from: Evlyn Clines P      Created: Wed Apr 03, 2013  1:35 PM       At least when she comes for treatment, please this RN or infusion RN recommend that she take ativan night of chemo whether or not any nausea and take zofran  AM after chemo whether or not any nausea; otherwise directions as on prescription prn nausea.       Please remind her that she might have aches from taxol, and be sure she has office contact # if questions or concerns.      thanks ------

## 2013-04-05 NOTE — Progress Notes (Unsigned)
Medical Oncology  Reports of bilateral screening mammogram done [facility] 03-19-2013 and right breast [facility] 03-19-2013 received and will be scanned into this EMR. The 3D mammogram, compared with 06-03-2004, found heterogeneously dense tissue and mass with obscured margin right breast 1 0'clock middle depth. US showed 8 mm simple cyst which appeared benign.

## 2013-04-05 NOTE — Patient Instructions (Signed)
[facility] Discharge Instructions for Patients Receiving Chemotherapy  Today you received the following chemotherapy agents taxol/carboplatin.   To help prevent nausea and vomiting after your treatment, we encourage you to take your nausea medication as directed.   Per Dr. Marko Plume, take ativan tonight after chemo, and take zofran the morning after chemo whether or not you are experiencing any nausea.    You may experience some aches from the taxol, please contact your physician if you have any questions or concerns.     If you develop nausea and vomiting that is not controlled by your nausea medication, call the clinic.   BELOW ARE SYMPTOMS THAT SHOULD BE REPORTED IMMEDIATELY:  *FEVER GREATER THAN 100.5 F  *CHILLS WITH OR WITHOUT FEVER  NAUSEA AND VOMITING THAT IS NOT CONTROLLED WITH YOUR NAUSEA MEDICATION  *UNUSUAL SHORTNESS OF BREATH  *UNUSUAL BRUISING OR BLEEDING  TENDERNESS IN MOUTH AND THROAT WITH OR WITHOUT PRESENCE OF ULCERS  *URINARY PROBLEMS  *BOWEL PROBLEMS  UNUSUAL RASH Items with * indicate a potential emergency and should be followed up as soon as possible.  Feel free to call the clinic you have any questions or concerns. The clinic phone number is [phone].

## 2013-04-05 NOTE — Telephone Encounter (Signed)
Reviewed information for antiemetics and post treatment information as noted below by Dr. Marko Plume.  Beverlee Nims RN will enter these directions in the patient 's instructions in the AVS.

## 2013-04-08 ENCOUNTER — Telehealth: Payer: Self-pay | Admitting: *Deleted

## 2013-04-08 NOTE — Telephone Encounter (Signed)
PT. HAS HAD SOME NAUSEA BUT NO VOMITING. SHE IS TAKING HER ANTIEMETIC. PT. IS EATING AND FORCING FLUIDS. NO PROBLEMS WITH DIARRHEA OR CONSTIPATION. SHE HAD A GOOD BOWEL MOVEMENT TODAY. PT. DID NOT MENTION ANY PROBLEMS WITH HER MOUTH AT THIS TIME. PT. HAD LOWER RIGHT SIDED SORENESS NEAR GROIN AREA WHEN SHE GOT UP THIS MORNING. SHE TOOK SOME TYLENOL WHICH HELPED. NOT DISCOMFORT AT THIS TIME. PT. MENTIONED THAT ON Saturday SHE HAD INTERCOURSE FOR THE FIRST TIME IN SEVERAL WEEKS. SHE NOTICED SMALL AMOUNT OF BLOOD AFTER INTERCOURSE BUT NOTHING SINCE Saturday. INSTRUCTED PT. IF SHE SHOULD HAVE SEVERE PAIN AND/OR SIGNIFICANT AMOUNT OF BLEEDING TO GO TO THE EMERGENCY ROOM FOR EVALUATION. ALSO INFORMED PT. THERE IS A PHYSICIAN ON CALL 24/7 IF NEEDED. SHE VOICES UNDERSTANDING. PT. HAS CHEMOTHERAPY INFORMATION SHEETS AVAILABLE.

## 2013-04-09 ENCOUNTER — Other Ambulatory Visit: Payer: Self-pay | Admitting: Oncology

## 2013-04-10 ENCOUNTER — Telehealth: Payer: Self-pay

## 2013-04-10 DIAGNOSIS — C541 Malignant neoplasm of endometrium: Secondary | ICD-10-CM

## 2013-04-10 MED ORDER — OXYCODONE-ACETAMINOPHEN 5-325 MG PO TABS: ORAL_TABLET | ORAL | Status: DC

## 2013-04-10 NOTE — Telephone Encounter (Signed)
Prescription place with injection nurse for pick up tomorrow. Told Ms. Skipper that the pain from the taxol aches may not bereleived with pain medication.  The further from the treatment the pain decreases. Some patients have reported that claritin 10 mg daily can help decrease the aches. Pt. Verbalized understanding.

## 2013-04-12 ENCOUNTER — Ambulatory Visit (HOSPITAL_BASED_OUTPATIENT_CLINIC_OR_DEPARTMENT_OTHER): Payer: 59 | Admitting: Oncology

## 2013-04-12 ENCOUNTER — Other Ambulatory Visit (HOSPITAL_BASED_OUTPATIENT_CLINIC_OR_DEPARTMENT_OTHER): Payer: 59

## 2013-04-12 ENCOUNTER — Encounter: Payer: Self-pay | Admitting: Oncology

## 2013-04-12 ENCOUNTER — Telehealth: Payer: Self-pay | Admitting: Oncology

## 2013-04-12 ENCOUNTER — Other Ambulatory Visit: Payer: Self-pay | Admitting: Family Medicine

## 2013-04-12 VITALS — BP 106/66 | HR 82 | Temp 98.3°F | Resp 18 | Ht 65.0 in | Wt 134.1 lb

## 2013-04-12 DIAGNOSIS — C779 Secondary and unspecified malignant neoplasm of lymph node, unspecified: Secondary | ICD-10-CM

## 2013-04-12 DIAGNOSIS — C549 Malignant neoplasm of corpus uteri, unspecified: Secondary | ICD-10-CM

## 2013-04-12 DIAGNOSIS — C541 Malignant neoplasm of endometrium: Secondary | ICD-10-CM

## 2013-04-12 DIAGNOSIS — C50919 Malignant neoplasm of unspecified site of unspecified female breast: Secondary | ICD-10-CM

## 2013-04-12 LAB — CBC WITH DIFFERENTIAL/PLATELET
BASO%: 0.8 % (ref 0.0–2.0)
BASOS ABS: 0 10*3/uL (ref 0.0–0.1)
EOS ABS: 0 10*3/uL (ref 0.0–0.5)
EOS%: 0 % (ref 0.0–7.0)
HCT: 34.3 % — ABNORMAL LOW (ref 34.8–46.6)
HGB: 11.5 g/dL — ABNORMAL LOW (ref 11.6–15.9)
LYMPH%: 35.3 % (ref 14.0–49.7)
MCH: 30.7 pg (ref 25.1–34.0)
MCHC: 33.5 g/dL (ref 31.5–36.0)
MCV: 91.8 fL (ref 79.5–101.0)
MONO#: 0.1 10*3/uL (ref 0.1–0.9)
MONO%: 3.1 % (ref 0.0–14.0)
NEUT#: 2.3 10*3/uL (ref 1.5–6.5)
NEUT%: 60.8 % (ref 38.4–76.8)
Platelets: 186 10*3/uL (ref 145–400)
RBC: 3.73 10*6/uL (ref 3.70–5.45)
RDW: 12.6 % (ref 11.2–14.5)
WBC: 3.8 10*3/uL — ABNORMAL LOW (ref 3.9–10.3)
lymph#: 1.3 10*3/uL (ref 0.9–3.3)

## 2013-04-12 MED ORDER — IBUPROFEN 600 MG PO TABS: ORAL_TABLET | ORAL | Status: DC

## 2013-04-12 MED ORDER — DEXAMETHASONE 4 MG PO TABS: ORAL_TABLET | ORAL | Status: DC

## 2013-04-12 MED ORDER — CEPHALEXIN 500 MG PO CAPS: 500.0000 mg | ORAL_CAPSULE | Freq: Three times a day (TID) | ORAL | Status: DC

## 2013-04-12 NOTE — Progress Notes (Signed)
OFFICE PROGRESS NOTE   04/12/2013   Physicians:Daniel ClarkePearson,[facility], Peggy Constant, ortho [facility]   INTERVAL HISTORY:   Patient is seen, together with her significant other, in follow up of first adjuvant taxol carboplatin given 04-05-13 for T1 endometrial cancer with postive washings. She has not had gCSF to date. Plan is for 3 cycles of this chemotherapy, analogous to treatment of IC ovarian cancer. Patient has tolerated the chemotherapy fairly well overall. She was extremely restless during chemo, which seems related to IV benadryl, better after low dose ativan then. She had aching in lower legs for a couple of days, nearly resolved now (did not pick up pain medication yet). She had minimal nausea controlled with prn zofran during day and ativan at hs. She had had slight constipation, tho bowels are moving. Scalp has had soreness/ not frank HA, likely as she is starting to lose hair. She noticed erythematous and slightly tender superficial area right pubis last pm, more red this AM. She has slight sore throat since yesterday, may be allergies. She has occasional clear sputum now, with tiny amount "black" noticed this AM. She denies peripheral neuropathy symptoms. IV access was not difficult.  ONCOLOGIC HISTORY Patient presented with intermittent abnormal vaginal bleeding x several years, treated with Megace then Provera. She had D&C by Dr Elly Modena 01-31-13 with complex hyperplasia and focal atypia of endometrium, and 4 cm cyst left ovary. She had TAH BSO by Dr Josephina Shih on 02-19-13, with frozen sections negative for invasive malignancy/ no identified malignancy, and no enlarged lymph nodes with benign appearing 3 cm cyst left ovary intraoperatively. Final pathology [id number]) had microscopic grade 1 endometrial cancer and multiple microscopic foci of high grade endometrial cancer and well as positive cytology on washings ([id number]). Post operative course has been  uneventful. She saw Dr Josephina Shih on 03-15-13, with recommendation for 3 cycles of carboplatin taxol as would be used in similar IC ovarian diagnosis. Cycle 1 carbo taxol was given 04-05-13.    Review of systems as above, also: Irritated area lateral to rectum since surgery, no worse. No fever. Is eating and is drinking fluids. Generally able to sleep. No LE swelling. Remainder of 10 point Review of Systems negative.  Objective:  Vital signs in last 24 hours:  BP 106/66  Pulse 82  Temp(Src) 98.3 F (36.8 C) (Oral)  Resp 18  Ht 5\' 5"  (1.651 m)  Wt 134 lb 1.6 oz (60.827 kg)  BMI 22.32 kg/m2  Alert, oriented and appropriate. Ambulatory without assistance difficulty.  Alopecia  HEENT:PERRL, sclerae not icteric. Oral mucosa moist without lesions, posterior pharynx clear.  Neck supple. No JVD.  Lymphatics:no cervical,suraclavicular, axillary or inguinal adenopathy Resp: clear to auscultation bilaterally and normal percussion bilaterally Cardio: regular rate and rhythm. No gallop. GI: soft, nontender, not distended, no mass or organomegaly. Normally active bowel sounds. Surgical incision not remarkable. Perirectal area with tiny skin tear lateral to rectum on left, no erythema, no evidence of infection. Right lateral pubic area where hair is growing back has 1 x 1.5 cm slightly firm area with erythema and slightly tender, not fluctuant, consistent with folliculitis. This is ~ 3 cm below the incision. Musculoskeletal/ Extremities: without pitting edema, cords, tenderness Neuro: no peripheral neuropathy. Otherwise nonfocal Skin without rash, ecchymosis, petechiae Breasts: without dominant mass, skin or nipple findings. Axillae benign. Portacath-without erythema or tenderness  Lab Results:  Results for orders placed in visit on 04/12/13  CBC WITH DIFFERENTIAL      Result Value Ref  Range   WBC 3.8 (*) 3.9 - 10.3 10e3/uL   NEUT# 2.3  1.5 - 6.5 10e3/uL   HGB 11.5 (*) 11.6 - 15.9 g/dL    HCT 34.3 (*) 34.8 - 46.6 %   Platelets 186  145 - 400 10e3/uL   MCV 91.8  79.5 - 101.0 fL   MCH 30.7  25.1 - 34.0 pg   MCHC 33.5  31.5 - 36.0 g/dL   RBC 3.73  3.70 - 5.45 10e6/uL   RDW 12.6  11.2 - 14.5 %   lymph# 1.3  0.9 - 3.3 10e3/uL   MONO# 0.1  0.1 - 0.9 10e3/uL   Eosinophils Absolute 0.0  0.0 - 0.5 10e3/uL   Basophils Absolute 0.0  0.0 - 0.1 10e3/uL   NEUT% 60.8  38.4 - 76.8 %   LYMPH% 35.3  14.0 - 49.7 %   MONO% 3.1  0.0 - 14.0 %   EOS% 0.0  0.0 - 7.0 %   BASO% 0.8  0.0 - 2.0 %     Studies/Results:  No results found.  Medications: I have reviewed the patient's current medications. As no problems with reactions cycle 1 and with severe restlessness, will decrease benadryl to 25 mg (IV or po) and add prn ativan to premeds for next treatments. Will start Keflex for the apparent folliculitis pubic area. She can use prn claritin and benadryl for environmental allergies.  DISCUSSION: we have reviewed blood counts today; she understands that these will likely decrease further in next week or so. She will call if any symptoms of infection worsening or for temp 100.5 or higher prior to recheck counts with MD visit ~ 4-1. (We will decide at visit 4-1 if she also needs to keep the visit presently scheduled for 4-6). She will do sitz baths for the perirectal irritation and warm soaks to apparent folliculitis area right pubis, + keflex.  She had more questions about surgical path, which we have discussed again now. Not clear from path report exactly how many areas of the microscopic cancer were found in endometrium, which report describes as "multiple". We also discussed again the positive washings and rationale for chemotherapy due to this.  Assessment/Plan: 1.T1aNxM0 endometrial carcinoma: multiple microscopic foci of high grade, minimally invasive adenocarcinoma of endometrium with positive cytology from pelvic washings. Post TAH BSO 02-19-2013. Plan 3 cycles of taxol carboplatin, started  04-05-13. Recheck counts and follow up today's concerns next week; if stable then may just recheck counts without MD 4-6. Cycle 2 due 4-10. 2.Probable folliculitis right pubic: warm soaks, Keflex. Perirectal skin tear/ irritation: sitz baths. Esophagitis ? Allergic vs viral, to use OTC allergy meds and will let us know if develops URI or lower respiratory symptoms. Follow up counts and these concerns next week. 3.restlessness with IV benadryl 50 mg premed cycle 1. Decrease to 25 mg po or IV, with prn ativan. 4.cervical spine surgery including fusion [place] for benign tumor Orthopedic rod RUE  5.no colonoscopy, which we will try to address after chemotherapy completes.  6.remote history of seizures, on dilantin by PCP  7.post cholecystectomy    Patient and friend in agreement with plan. Time spent 25 min including >50% counseling and coordination of care.    LIVESAY,LENNIS P, MD   04/12/2013, 9:40 AM

## 2013-04-12 NOTE — Patient Instructions (Signed)
We will send prescription for cephalexin (Keflex) antibiotic to your pharmacy, to start for the red area at pubis. Also do warm soaks there and sitz baths for irritated area at rectum.  Call if sore throat seems to be a cold. Fine to use claritin and benadryl as needed for allergies. Call if temperature >=100.5 before next visit. Dr Marko Plume will check on these problems + repeat labs next week.

## 2013-04-12 NOTE — Telephone Encounter (Signed)
, °

## 2013-04-14 ENCOUNTER — Other Ambulatory Visit: Payer: Self-pay | Admitting: Oncology

## 2013-04-14 DIAGNOSIS — C541 Malignant neoplasm of endometrium: Secondary | ICD-10-CM

## 2013-04-16 ENCOUNTER — Encounter: Payer: Self-pay | Admitting: Oncology

## 2013-04-16 ENCOUNTER — Other Ambulatory Visit (HOSPITAL_BASED_OUTPATIENT_CLINIC_OR_DEPARTMENT_OTHER): Payer: 59

## 2013-04-16 ENCOUNTER — Telehealth: Payer: Self-pay | Admitting: Oncology

## 2013-04-16 ENCOUNTER — Ambulatory Visit (HOSPITAL_BASED_OUTPATIENT_CLINIC_OR_DEPARTMENT_OTHER): Payer: 59 | Admitting: Oncology

## 2013-04-16 VITALS — BP 112/72 | HR 84 | Temp 98.0°F | Resp 18 | Ht 65.0 in | Wt 135.3 lb

## 2013-04-16 DIAGNOSIS — C549 Malignant neoplasm of corpus uteri, unspecified: Secondary | ICD-10-CM

## 2013-04-16 DIAGNOSIS — C541 Malignant neoplasm of endometrium: Secondary | ICD-10-CM

## 2013-04-16 DIAGNOSIS — Z5189 Encounter for other specified aftercare: Secondary | ICD-10-CM

## 2013-04-16 DIAGNOSIS — C50919 Malignant neoplasm of unspecified site of unspecified female breast: Secondary | ICD-10-CM

## 2013-04-16 DIAGNOSIS — L678 Other hair color and hair shaft abnormalities: Secondary | ICD-10-CM

## 2013-04-16 DIAGNOSIS — C779 Secondary and unspecified malignant neoplasm of lymph node, unspecified: Secondary | ICD-10-CM

## 2013-04-16 DIAGNOSIS — L738 Other specified follicular disorders: Secondary | ICD-10-CM

## 2013-04-16 LAB — CBC WITH DIFFERENTIAL/PLATELET
BASO%: 0.6 % (ref 0.0–2.0)
Basophils Absolute: 0 10*3/uL (ref 0.0–0.1)
EOS%: 0 % (ref 0.0–7.0)
Eosinophils Absolute: 0 10*3/uL (ref 0.0–0.5)
HCT: 34.2 % — ABNORMAL LOW (ref 34.8–46.6)
HGB: 11.3 g/dL — ABNORMAL LOW (ref 11.6–15.9)
LYMPH%: 51.6 % — AB (ref 14.0–49.7)
MCH: 30.4 pg (ref 25.1–34.0)
MCHC: 33.2 g/dL (ref 31.5–36.0)
MCV: 91.6 fL (ref 79.5–101.0)
MONO#: 0.3 10*3/uL (ref 0.1–0.9)
MONO%: 11.7 % (ref 0.0–14.0)
NEUT#: 1 10*3/uL — ABNORMAL LOW (ref 1.5–6.5)
NEUT%: 36.1 % — AB (ref 38.4–76.8)
PLATELETS: 184 10*3/uL (ref 145–400)
RBC: 3.74 10*6/uL (ref 3.70–5.45)
RDW: 12.8 % (ref 11.2–14.5)
WBC: 2.8 10*3/uL — ABNORMAL LOW (ref 3.9–10.3)
lymph#: 1.5 10*3/uL (ref 0.9–3.3)

## 2013-04-16 LAB — COMPREHENSIVE METABOLIC PANEL (CC13)
ALK PHOS: 53 U/L (ref 40–150)
ALT: 16 U/L (ref 0–55)
ANION GAP: 9 meq/L (ref 3–11)
AST: 18 U/L (ref 5–34)
Albumin: 3.7 g/dL (ref 3.5–5.0)
BILIRUBIN TOTAL: 0.24 mg/dL (ref 0.20–1.20)
BUN: 19.3 mg/dL (ref 7.0–26.0)
CO2: 25 meq/L (ref 22–29)
CREATININE: 0.7 mg/dL (ref 0.6–1.1)
Calcium: 8.9 mg/dL (ref 8.4–10.4)
Chloride: 106 mEq/L (ref 98–109)
Glucose: 104 mg/dl (ref 70–140)
Potassium: 4.1 mEq/L (ref 3.5–5.1)
SODIUM: 140 meq/L (ref 136–145)
TOTAL PROTEIN: 6.7 g/dL (ref 6.4–8.3)

## 2013-04-16 MED ORDER — TBO-FILGRASTIM 300 MCG/0.5ML ~~LOC~~ SOSY
300.0000 ug | PREFILLED_SYRINGE | Freq: Once | SUBCUTANEOUS | Status: AC
  Administered 2013-04-16: 300 ug via SUBCUTANEOUS
  Filled 2013-04-16: qty 0.5

## 2013-04-16 NOTE — Progress Notes (Signed)
OFFICE PROGRESS NOTE   04/16/2013   Physicians:Daniel ClarkePearson,[facility], Peggy Constant, ortho [facility]   INTERVAL HISTORY:  Patient is seen, alone for visit, in follow up of counts, area of folliculitis and slight esophagitis symptoms since first adjuvant taxol carboplatin given 04-05-13 for T1 endometrial cancer with positive washings. She has not had gCSF. She has taken keflex only twice daily since beginning on 3-27, also warm soaks to the folliculitis area, improving. She has not had other symptoms of viral respiratory infection, does seem to have mild environmental allergies and will start claritin. The perirectal symptoms are not bothering now. She has no new concerns, a little tired after not sleeping too well last pm, is working today. No nausea now. Bowels ok.   ONCOLOGIC HISTORY  Review of systems as above, also: Clear nasal drainage and occasional sputum, no further dark noted. No bleeding. No peripheral neuropathy. No fever or symptoms of infection otherwise. Remainder of 10 point Review of Systems negative.  Objective:  Vital signs in last 24 hours:  BP 112/72  Pulse 84  Temp(Src) 98 F (36.7 C) (Oral)  Resp 18  Ht 5\' 5"  (1.651 m)  Wt 135 lb 4.8 oz (61.372 kg)  BMI 22.52 kg/m2  SpO2 100%  Alert, oriented and appropriate. Ambulatory without difficulty.  No alopecia. Looks comfortable.  HEENT:PERRL, sclerae not icteric. Oral mucosa moist without lesions, posterior pharynx slight dull erythema without exudate. Nares not remarkable. Neck supple. No JVD.  Lymphatics:no cervical,suraclavicular, axillary or inguinal adenopathy Resp: clear to auscultation bilaterally and normal percussion bilaterally Cardio: regular rate and rhythm. No gallop. GI: soft, nontender, not distended, no mass or organomegaly. Normally active bowel sounds. Surgical incision not remarkable. Musculoskeletal/ Extremities: without pitting edema, cords, tenderness Neuro: no  peripheral neuropathy. Otherwise nonfocal Skin without rash, ecchymosis, petechiae. Area of folliculitis at right pubic area less erythema and less tender, still 1 x 2 cm with slight central head now.   Lab Results:  Results for orders placed in visit on 04/16/13  CBC WITH DIFFERENTIAL      Result Value Ref Range   WBC 2.8 (*) 3.9 - 10.3 10e3/uL   NEUT# 1.0 (*) 1.5 - 6.5 10e3/uL   HGB 11.3 (*) 11.6 - 15.9 g/dL   HCT 34.2 (*) 34.8 - 46.6 %   Platelets 184  145 - 400 10e3/uL   MCV 91.6  79.5 - 101.0 fL   MCH 30.4  25.1 - 34.0 pg   MCHC 33.2  31.5 - 36.0 g/dL   RBC 3.74  3.70 - 5.45 10e6/uL   RDW 12.8  11.2 - 14.5 %   lymph# 1.5  0.9 - 3.3 10e3/uL   MONO# 0.3  0.1 - 0.9 10e3/uL   Eosinophils Absolute 0.0  0.0 - 0.5 10e3/uL   Basophils Absolute 0.0  0.0 - 0.1 10e3/uL   NEUT% 36.1 (*) 38.4 - 76.8 %   LYMPH% 51.6 (*) 14.0 - 49.7 %   MONO% 11.7  0.0 - 14.0 %   EOS% 0.0  0.0 - 7.0 %   BASO% 0.6  0.0 - 2.0 %  COMPREHENSIVE METABOLIC PANEL (ML54)      Result Value Ref Range   Sodium 140  136 - 145 mEq/L   Potassium 4.1  3.5 - 5.1 mEq/L   Chloride 106  98 - 109 mEq/L   CO2 25  22 - 29 mEq/L   Glucose 104  70 - 140 mg/dl   BUN 19.3  7.0 - 26.0  mg/dL   Creatinine 0.7  0.6 - 1.1 mg/dL   Total Bilirubin 0.24  0.20 - 1.20 mg/dL   Alkaline Phosphatase 53  40 - 150 U/L   AST 18  5 - 34 U/L   ALT 16  0 - 55 U/L   Total Protein 6.7  6.4 - 8.3 g/dL   Albumin 3.7  3.5 - 5.0 g/dL   Calcium 8.9  8.4 - 10.4 mg/dL   Anion Gap 9  3 - 11 mEq/L     Studies/Results:  No results found.  Medications: I have reviewed the patient's current medications. She will continue Keflex every 8 hours. Neupogen (granix) 300 to be given today and 4-1, then repeat CBC on 4-3 with neupogen also that day if ANC <1.5. Note platelets have not dropped further today, so likely past nadir now. Chemo orders completed using renal function from labs today.  DISCUSSION: she has resumed sexual activity. I reminded her  about using condoms after chemo.  Assessment/Plan:  1.T1aNxM0 endometrial carcinoma: multiple microscopic foci of high grade, minimally invasive adenocarcinoma of endometrium with positive cytology from pelvic washings. Post TAH BSO 02-19-2013. Plan 3 cycles of taxol carboplatin, started 04-05-13. ANC 1.0 today, so will add at least 2 doses neupogen and repeat counts 4-3. Cycle 2 due 4-10 as long as ANC >=1.5 and plt >=100k. I will see her 4-16 and will begin gCSF that day. 2.Probable folliculitis right pubic: improved, continue warm soaks, Keflex q 8 hr Perirectal skin tear/ irritation: no symptoms now. Esophagitis: Allergic, add claritin especially with gCSF for aches. 3.restlessness with IV benadryl 50 mg premed cycle 1. Decrease to 25 mg po or IV, with prn ativan.  4.cervical spine surgery including fusion [place] for benign tumor Orthopedic rod RUE  5.no colonoscopy, which we will try to address after chemotherapy completes.  6.remote history of seizures, on dilantin by PCP  7.post cholecystectomy   Patient understands discussion and is in agreement with plan.    LIVESAY,LENNIS P, MD   04/16/2013, 10:05 AM

## 2013-04-16 NOTE — Telephone Encounter (Signed)
, °

## 2013-04-17 ENCOUNTER — Ambulatory Visit (HOSPITAL_BASED_OUTPATIENT_CLINIC_OR_DEPARTMENT_OTHER): Payer: 59

## 2013-04-17 VITALS — BP 109/60 | HR 96 | Temp 98.2°F

## 2013-04-17 DIAGNOSIS — C541 Malignant neoplasm of endometrium: Secondary | ICD-10-CM

## 2013-04-17 DIAGNOSIS — C549 Malignant neoplasm of corpus uteri, unspecified: Secondary | ICD-10-CM

## 2013-04-17 DIAGNOSIS — C779 Secondary and unspecified malignant neoplasm of lymph node, unspecified: Secondary | ICD-10-CM

## 2013-04-17 DIAGNOSIS — Z5189 Encounter for other specified aftercare: Secondary | ICD-10-CM

## 2013-04-17 DIAGNOSIS — C50919 Malignant neoplasm of unspecified site of unspecified female breast: Secondary | ICD-10-CM

## 2013-04-17 MED ORDER — TBO-FILGRASTIM 300 MCG/0.5ML ~~LOC~~ SOSY
300.0000 ug | PREFILLED_SYRINGE | Freq: Once | SUBCUTANEOUS | Status: AC
  Administered 2013-04-17: 300 ug via SUBCUTANEOUS
  Filled 2013-04-17: qty 0.5

## 2013-04-18 ENCOUNTER — Other Ambulatory Visit: Payer: Self-pay | Admitting: Oncology

## 2013-04-19 ENCOUNTER — Telehealth: Payer: Self-pay

## 2013-04-19 ENCOUNTER — Other Ambulatory Visit (HOSPITAL_BASED_OUTPATIENT_CLINIC_OR_DEPARTMENT_OTHER): Payer: 59

## 2013-04-19 DIAGNOSIS — C549 Malignant neoplasm of corpus uteri, unspecified: Secondary | ICD-10-CM

## 2013-04-19 DIAGNOSIS — C541 Malignant neoplasm of endometrium: Secondary | ICD-10-CM

## 2013-04-19 LAB — CBC WITH DIFFERENTIAL/PLATELET
BASO%: 0.3 % (ref 0.0–2.0)
BASOS ABS: 0 10*3/uL (ref 0.0–0.1)
EOS%: 0.1 % (ref 0.0–7.0)
Eosinophils Absolute: 0 10*3/uL (ref 0.0–0.5)
HEMATOCRIT: 34 % — AB (ref 34.8–46.6)
HEMOGLOBIN: 11.4 g/dL — AB (ref 11.6–15.9)
LYMPH%: 26.8 % (ref 14.0–49.7)
MCH: 31.2 pg (ref 25.1–34.0)
MCHC: 33.4 g/dL (ref 31.5–36.0)
MCV: 93.3 fL (ref 79.5–101.0)
MONO#: 1.2 10*3/uL — ABNORMAL HIGH (ref 0.1–0.9)
MONO%: 11.5 % (ref 0.0–14.0)
NEUT#: 6.3 10*3/uL (ref 1.5–6.5)
NEUT%: 61.3 % (ref 38.4–76.8)
PLATELETS: 155 10*3/uL (ref 145–400)
RBC: 3.65 10*6/uL — ABNORMAL LOW (ref 3.70–5.45)
RDW: 13.6 % (ref 11.2–14.5)
WBC: 10.2 10*3/uL (ref 3.9–10.3)
lymph#: 2.7 10*3/uL (ref 0.9–3.3)

## 2013-04-19 NOTE — Telephone Encounter (Signed)
Told Brandi Warren that her CBC was good. ANC=6.3.  No neupogen injection needed per Dr. Mariana Kaufman parameter's stated in office note 04-16-13.  ANC > 1.5 no neupogen. Brandi Warren verbalized understanding.

## 2013-04-22 ENCOUNTER — Other Ambulatory Visit: Payer: Self-pay

## 2013-04-22 ENCOUNTER — Ambulatory Visit: Payer: Self-pay | Admitting: Oncology

## 2013-04-25 ENCOUNTER — Other Ambulatory Visit: Payer: Self-pay

## 2013-04-26 ENCOUNTER — Encounter: Payer: Self-pay | Admitting: Gynecology

## 2013-04-26 ENCOUNTER — Other Ambulatory Visit (HOSPITAL_BASED_OUTPATIENT_CLINIC_OR_DEPARTMENT_OTHER): Payer: 59

## 2013-04-26 ENCOUNTER — Ambulatory Visit (HOSPITAL_BASED_OUTPATIENT_CLINIC_OR_DEPARTMENT_OTHER): Payer: 59

## 2013-04-26 ENCOUNTER — Ambulatory Visit: Payer: 59 | Attending: Gynecology | Admitting: Gynecology

## 2013-04-26 VITALS — BP 124/69 | HR 81 | Temp 97.8°F

## 2013-04-26 VITALS — BP 120/64 | HR 84 | Temp 97.9°F | Resp 18 | Wt 133.9 lb

## 2013-04-26 DIAGNOSIS — Z5111 Encounter for antineoplastic chemotherapy: Secondary | ICD-10-CM

## 2013-04-26 DIAGNOSIS — C541 Malignant neoplasm of endometrium: Secondary | ICD-10-CM

## 2013-04-26 DIAGNOSIS — Z79899 Other long term (current) drug therapy: Secondary | ICD-10-CM | POA: Insufficient documentation

## 2013-04-26 DIAGNOSIS — Z90710 Acquired absence of both cervix and uterus: Secondary | ICD-10-CM | POA: Insufficient documentation

## 2013-04-26 DIAGNOSIS — C549 Malignant neoplasm of corpus uteri, unspecified: Secondary | ICD-10-CM | POA: Insufficient documentation

## 2013-04-26 DIAGNOSIS — Z87891 Personal history of nicotine dependence: Secondary | ICD-10-CM | POA: Insufficient documentation

## 2013-04-26 DIAGNOSIS — IMO0002 Reserved for concepts with insufficient information to code with codable children: Secondary | ICD-10-CM | POA: Insufficient documentation

## 2013-04-26 LAB — CBC WITH DIFFERENTIAL/PLATELET
BASO%: 0 % (ref 0.0–2.0)
BASOS ABS: 0 10*3/uL (ref 0.0–0.1)
EOS%: 0 % (ref 0.0–7.0)
Eosinophils Absolute: 0 10*3/uL (ref 0.0–0.5)
HCT: 37.8 % (ref 34.8–46.6)
HGB: 12.9 g/dL (ref 11.6–15.9)
LYMPH%: 16.3 % (ref 14.0–49.7)
MCH: 31 pg (ref 25.1–34.0)
MCHC: 34.1 g/dL (ref 31.5–36.0)
MCV: 90.9 fL (ref 79.5–101.0)
MONO#: 0 10*3/uL — ABNORMAL LOW (ref 0.1–0.9)
MONO%: 0.4 % (ref 0.0–14.0)
NEUT#: 2 10*3/uL (ref 1.5–6.5)
NEUT%: 83.3 % — ABNORMAL HIGH (ref 38.4–76.8)
Platelets: 153 10*3/uL (ref 145–400)
RBC: 4.16 10*6/uL (ref 3.70–5.45)
RDW: 13.9 % (ref 11.2–14.5)
WBC: 2.4 10*3/uL — ABNORMAL LOW (ref 3.9–10.3)
lymph#: 0.4 10*3/uL — ABNORMAL LOW (ref 0.9–3.3)

## 2013-04-26 MED ORDER — ONDANSETRON 16 MG/50ML IVPB (CHCC)
16.0000 mg | Freq: Once | INTRAVENOUS | Status: AC
  Administered 2013-04-26: 16 mg via INTRAVENOUS

## 2013-04-26 MED ORDER — DIPHENHYDRAMINE HCL 50 MG/ML IJ SOLN
INTRAMUSCULAR | Status: AC
  Filled 2013-04-26: qty 1

## 2013-04-26 MED ORDER — LORAZEPAM 2 MG/ML IJ SOLN
INTRAMUSCULAR | Status: AC
  Filled 2013-04-26: qty 1

## 2013-04-26 MED ORDER — ONDANSETRON 16 MG/50ML IVPB (CHCC)
INTRAVENOUS | Status: AC
  Filled 2013-04-26: qty 16

## 2013-04-26 MED ORDER — SODIUM CHLORIDE 0.9 % IV SOLN
175.0000 mg/m2 | Freq: Once | INTRAVENOUS | Status: AC
  Administered 2013-04-26: 288 mg via INTRAVENOUS
  Filled 2013-04-26: qty 48

## 2013-04-26 MED ORDER — DIPHENHYDRAMINE HCL 50 MG/ML IJ SOLN
25.0000 mg | Freq: Once | INTRAMUSCULAR | Status: AC
Start: 2013-04-26 — End: 2013-04-26
  Administered 2013-04-26: 25 mg via INTRAVENOUS

## 2013-04-26 MED ORDER — DEXAMETHASONE SODIUM PHOSPHATE 20 MG/5ML IJ SOLN
INTRAMUSCULAR | Status: AC
  Filled 2013-04-26: qty 5

## 2013-04-26 MED ORDER — FAMOTIDINE IN NACL 20-0.9 MG/50ML-% IV SOLN
20.0000 mg | Freq: Once | INTRAVENOUS | Status: AC
  Administered 2013-04-26: 20 mg via INTRAVENOUS

## 2013-04-26 MED ORDER — DEXAMETHASONE SODIUM PHOSPHATE 20 MG/5ML IJ SOLN
20.0000 mg | Freq: Once | INTRAMUSCULAR | Status: AC
  Administered 2013-04-26: 20 mg via INTRAVENOUS

## 2013-04-26 MED ORDER — SODIUM CHLORIDE 0.9 % IV SOLN
525.5000 mg | Freq: Once | INTRAVENOUS | Status: AC
  Administered 2013-04-26: 530 mg via INTRAVENOUS
  Filled 2013-04-26: qty 53

## 2013-04-26 MED ORDER — FAMOTIDINE IN NACL 20-0.9 MG/50ML-% IV SOLN
INTRAVENOUS | Status: AC
  Filled 2013-04-26: qty 50

## 2013-04-26 MED ORDER — LORAZEPAM 2 MG/ML IJ SOLN
0.5000 mg | Freq: Once | INTRAMUSCULAR | Status: AC | PRN
  Administered 2013-04-26: 0.5 mg via INTRAVENOUS

## 2013-04-26 MED ORDER — SODIUM CHLORIDE 0.9 % IV SOLN
Freq: Once | INTRAVENOUS | Status: AC
  Administered 2013-04-26: 11:00:00 via INTRAVENOUS

## 2013-04-26 NOTE — Patient Instructions (Signed)
[facility] Discharge Instructions for Patients Receiving Chemotherapy  Today you received the following chemotherapy agents: Taxol and Carboplatin. To help prevent nausea and vomiting after your treatment, we encourage you to take your nausea medication, Zofran. Take one every 12 hours for three days. Begin the morning of 4/11.    If you develop nausea and vomiting that is not controlled by your nausea medication, call the clinic.   BELOW ARE SYMPTOMS THAT SHOULD BE REPORTED IMMEDIATELY:  *FEVER GREATER THAN 100.5 F  *CHILLS WITH OR WITHOUT FEVER  NAUSEA AND VOMITING THAT IS NOT CONTROLLED WITH YOUR NAUSEA MEDICATION  *UNUSUAL SHORTNESS OF BREATH  *UNUSUAL BRUISING OR BLEEDING  TENDERNESS IN MOUTH AND THROAT WITH OR WITHOUT PRESENCE OF ULCERS  *URINARY PROBLEMS  *BOWEL PROBLEMS  UNUSUAL RASH Items with * indicate a potential emergency and should be followed up as soon as possible.  Feel free to call the clinic should you have any questions or concerns. The clinic phone number is [phone].

## 2013-04-26 NOTE — Patient Instructions (Signed)
Return to see me on the 29th. We'll obtain a CT scan and CA 125 prior to that visit.
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
## 2013-04-28 ENCOUNTER — Other Ambulatory Visit: Payer: Self-pay | Admitting: Oncology

## 2013-04-29 ENCOUNTER — Ambulatory Visit (HOSPITAL_BASED_OUTPATIENT_CLINIC_OR_DEPARTMENT_OTHER): Payer: 59 | Admitting: Oncology

## 2013-04-29 ENCOUNTER — Encounter: Payer: Self-pay | Admitting: Oncology

## 2013-04-29 ENCOUNTER — Other Ambulatory Visit (HOSPITAL_BASED_OUTPATIENT_CLINIC_OR_DEPARTMENT_OTHER): Payer: 59

## 2013-04-29 VITALS — BP 112/81 | HR 89 | Temp 98.3°F | Resp 18 | Ht 65.0 in | Wt 135.1 lb

## 2013-04-29 DIAGNOSIS — C541 Malignant neoplasm of endometrium: Secondary | ICD-10-CM

## 2013-04-29 DIAGNOSIS — C549 Malignant neoplasm of corpus uteri, unspecified: Secondary | ICD-10-CM

## 2013-04-29 DIAGNOSIS — D709 Neutropenia, unspecified: Secondary | ICD-10-CM

## 2013-04-29 LAB — CBC WITH DIFFERENTIAL/PLATELET
BASO%: 0.7 % (ref 0.0–2.0)
BASOS ABS: 0 10*3/uL (ref 0.0–0.1)
EOS%: 0.1 % (ref 0.0–7.0)
Eosinophils Absolute: 0 10*3/uL (ref 0.0–0.5)
HCT: 36.8 % (ref 34.8–46.6)
HEMOGLOBIN: 12.2 g/dL (ref 11.6–15.9)
LYMPH#: 1.4 10*3/uL (ref 0.9–3.3)
LYMPH%: 42.6 % (ref 14.0–49.7)
MCH: 30.8 pg (ref 25.1–34.0)
MCHC: 33.2 g/dL (ref 31.5–36.0)
MCV: 92.9 fL (ref 79.5–101.0)
MONO#: 0.1 10*3/uL (ref 0.1–0.9)
MONO%: 2.9 % (ref 0.0–14.0)
NEUT%: 53.7 % (ref 38.4–76.8)
NEUTROS ABS: 1.8 10*3/uL (ref 1.5–6.5)
Platelets: 128 10*3/uL — ABNORMAL LOW (ref 145–400)
RBC: 3.96 10*6/uL (ref 3.70–5.45)
RDW: 14.5 % (ref 11.2–14.5)
WBC: 3.4 10*3/uL — ABNORMAL LOW (ref 3.9–10.3)

## 2013-04-29 LAB — COMPREHENSIVE METABOLIC PANEL (CC13)
ALT: 15 U/L (ref 0–55)
AST: 19 U/L (ref 5–34)
Albumin: 3.8 g/dL (ref 3.5–5.0)
Alkaline Phosphatase: 52 U/L (ref 40–150)
Anion Gap: 8 mEq/L (ref 3–11)
BUN: 19.8 mg/dL (ref 7.0–26.0)
CALCIUM: 9.4 mg/dL (ref 8.4–10.4)
CHLORIDE: 103 meq/L (ref 98–109)
CO2: 28 mEq/L (ref 22–29)
Creatinine: 0.7 mg/dL (ref 0.6–1.1)
Glucose: 99 mg/dl (ref 70–140)
POTASSIUM: 4.6 meq/L (ref 3.5–5.1)
Sodium: 139 mEq/L (ref 136–145)
Total Bilirubin: 0.39 mg/dL (ref 0.20–1.20)
Total Protein: 7 g/dL (ref 6.4–8.3)

## 2013-04-29 MED ORDER — PEGFILGRASTIM INJECTION 6 MG/0.6ML
6.0000 mg | Freq: Once | SUBCUTANEOUS | Status: AC
  Administered 2013-04-29: 6 mg via SUBCUTANEOUS
  Filled 2013-04-29: qty 0.6

## 2013-04-29 NOTE — Progress Notes (Signed)
OFFICE PROGRESS NOTE   04/29/2013   Physicians:Daniel ClarkePearson,[facility], Peggy Constant, ortho [facility]   INTERVAL HISTORY:   Patient is seen, alone for visit, in continuing attention to adjuvant chemotherapy in process for T1 endometrial cancer with positive washings, planning 3 cycles of taxol and carboplatin. She had cycle 2 on 04-26-13 and will have gCSF today due to neutropenia cycle 1. She has tolerated most recent treatment fairly well, with some fatigue, no severe nausea, some mild taxol aches. She denies peripheral neuropathy symptoms. Bowels are moving and she has been able to drink fluids.  She saw Dr Josephina Shih on 04-26-13, doing well from surgery standpoint and no change in plan. She will need CT CAP and repeat CA 125 a month after treatment completes, then will see him 06-14-13.  She does not have PAC.  ONCOLOGIC HISTORY Patient presented with intermittent abnormal vaginal bleeding x several years, treated with Megace then Provera. She had D&C by Dr Elly Modena 01-31-13 with complex hyperplasia and focal atypia of endometrium, and 4 cm cyst left ovary. She had TAH BSO by Dr Josephina Shih on 02-19-13, with frozen sections negative for invasive malignancy/ no identified malignancy, and no enlarged lymph nodes with benign appearing 3 cm cyst left ovary intraoperatively. Final pathology [id number]) had microscopic grade 1 endometrial cancer and multiple microscopic foci of high grade endometrial cancer and well as positive cytology on washings ([id number]). Post operative course has been uneventful. She saw Dr Josephina Shih on 03-15-13, with recommendation for 3 cycles of carboplatin taxol as would be used in similar IC ovarian diagnosis. Cycle 1 carbo taxol was given 04-05-13.  Review of systems as above, also: No problems at site of IV. No fever or symptoms of infection. No bleeding. No LE swelling. No rectal irritation now Remainder of 10 point Review of Systems  negative.  Objective:  Vital signs in last 24 hours:  BP 112/81  Pulse 89  Temp(Src) 98.3 F (36.8 C) (Oral)  Resp 18  Ht 5\' 5"  (1.651 m)  Wt 135 lb 1.6 oz (61.281 kg)  BMI 22.48 kg/m2 weight is up 2 lbs  Alert, oriented and appropriate. Ambulatory without difficulty.  Alopecia  HEENT:PERRL, sclerae not icteric. Oral mucosa moist without lesions, posterior pharynx clear.  Neck supple. No JVD.  Lymphatics:no cervical,suraclavicular, axillary or inguinal adenopathy Resp: clear to auscultation bilaterally and normal percussion bilaterally Cardio: regular rate and rhythm. No gallop. GI: soft, nontender, not distended, no mass or organomegaly. Normally active bowel sounds. Surgical incision not remarkable. Musculoskeletal/ Extremities: without pitting edema, cords, tenderness Neuro: no peripheral neuropathy. Otherwise nonfocal Skin without rash, ecchymosis, petechiae. Folliculitis right pubic area resolved Portacath-without erythema or tenderness  Lab Results:  Results for orders placed in visit on 04/29/13  CBC WITH DIFFERENTIAL      Result Value Ref Range   WBC 3.4 (*) 3.9 - 10.3 10e3/uL   NEUT# 1.8  1.5 - 6.5 10e3/uL   HGB 12.2  11.6 - 15.9 g/dL   HCT 36.8  34.8 - 46.6 %   Platelets 128 (*) 145 - 400 10e3/uL   MCV 92.9  79.5 - 101.0 fL   MCH 30.8  25.1 - 34.0 pg   MCHC 33.2  31.5 - 36.0 g/dL   RBC 3.96  3.70 - 5.45 10e6/uL   RDW 14.5  11.2 - 14.5 %   lymph# 1.4  0.9 - 3.3 10e3/uL   MONO# 0.1  0.1 - 0.9 10e3/uL   Eosinophils Absolute 0.0  0.0 - 0.5 10e3/uL  Basophils Absolute 0.0  0.0 - 0.1 10e3/uL   NEUT% 53.7  38.4 - 76.8 %   LYMPH% 42.6  14.0 - 49.7 %   MONO% 2.9  0.0 - 14.0 %   EOS% 0.1  0.0 - 7.0 %   BASO% 0.7  0.0 - 2.0 %  COMPREHENSIVE METABOLIC PANEL (BW62)      Result Value Ref Range   Sodium 139  136 - 145 mEq/L   Potassium 4.6  3.5 - 5.1 mEq/L   Chloride 103  98 - 109 mEq/L   CO2 28  22 - 29 mEq/L   Glucose 99  70 - 140 mg/dl   BUN 19.8  7.0 - 26.0  mg/dL   Creatinine 0.7  0.6 - 1.1 mg/dL   Total Bilirubin 0.39  0.20 - 1.20 mg/dL   Alkaline Phosphatase 52  40 - 150 U/L   AST 19  5 - 34 U/L   ALT 15  0 - 55 U/L   Total Protein 7.0  6.4 - 8.3 g/dL   Albumin 3.8  3.5 - 5.0 g/dL   Calcium 9.4  8.4 - 10.4 mg/dL   Anion Gap 8  3 - 11 mEq/L     Studies/Results: CT CAP scheduled for 06-11-13   Medications: I have reviewed the patient's current medications. She has been given neulasta today and will begin claritin.  DISCUSSION : patient is aware that she may have aches from neulasta, will begin claritin today. She understands plan for restaging scans after 3 cycles of chemotherapy.  Assessment/Plan: 1.T1aNxM0 endometrial carcinoma: multiple microscopic foci of high grade, minimally invasive adenocarcinoma of endometrium with positive cytology from pelvic washings. Post TAH BSO 02-19-2013. Plan 3 cycles of taxol carboplatin, started 04-05-13. Neulasta given today for cycle 2. I will see her with lab 4-24 prior to cycle 3 on May 1  2.Probable folliculitis right pubic: improved, continue warm soaks, Keflex q 8 hr Perirectal skin tear/ irritation: no symptoms now. Esophagitis: Allergic, add claritin especially with gCSF for aches.  3.restlessness with IV benadryl 50 mg premed cycle 1. Tolerated better at 25 mg, with prn ativan.  4.cervical spine surgery including fusion [place] for benign tumor Orthopedic rod RUE  5.no colonoscopy, which we will try to address after chemotherapy completes.  6.remote history of seizures, on dilantin by PCP  7.post cholecystectomy        Gordy Levan, MD   04/29/2013, 9:59 AM

## 2013-05-01 ENCOUNTER — Telehealth: Payer: Self-pay | Admitting: Oncology

## 2013-05-01 NOTE — Telephone Encounter (Signed)
lvm for pt regarding to April adn May appt....mailed pt appt sched/avs and letter

## 2013-05-06 ENCOUNTER — Telehealth: Payer: Self-pay | Admitting: *Deleted

## 2013-05-06 NOTE — Telephone Encounter (Signed)
Received voice message from patient that she is taking Benadryl for itching and also has burning feet. I returned phone call but the line was busy.

## 2013-05-07 ENCOUNTER — Other Ambulatory Visit: Payer: Self-pay | Admitting: Oncology

## 2013-05-07 ENCOUNTER — Telehealth: Payer: Self-pay | Admitting: *Deleted

## 2013-05-07 NOTE — Telephone Encounter (Signed)
Called patient to follow up on itching. States she took benadryl last night and an aveeno shower this morning- itching is better. No red spots noted

## 2013-05-08 ENCOUNTER — Telehealth: Payer: Self-pay

## 2013-05-08 ENCOUNTER — Telehealth: Payer: Self-pay | Admitting: *Deleted

## 2013-05-08 NOTE — Telephone Encounter (Signed)
Brandi Warren denies using any new detergents or eating any new food.Denies difficulty with breathing. She said that ~30 years ago her dilantin level was too high and she had itching and red splotches on her legs.  She has not had a dilantin level done in a long time.  She has not followed up with PCP in a while. She will discuss  Having a dilantin level drawn with Dr. Marko Plume at visit 05-10-13. Told her to continue benadryl 25-50 mg q 4-6 hours as needed.  She can add zyrtec OTC and use 1% hydrocortisone cream to red splotchy areas on legs per Dr. Marko Plume.  Brandi Warren verbalized understanding.

## 2013-05-08 NOTE — Telephone Encounter (Signed)
   Provider input needed: Itching   Reason for call: Worsened and benadryl is not working  Integument   ALLERGIES:  is allergic to sulfa antibiotics.  Patient last received chemotherapy/ treatment on 04-26-2013 carbo/Taxol  Patient was last seen in the office on 04-29-2013  Next appt is 05-10-2013  Is patient having fevers greater than 100.5?  no   Is patient having uncontrolled pain, or new pain? no   Is patient having new back pain that changes with position (worsens or eases when laying down?)  no   Is patient able to eat and drink? no    Is patient able to pass stool without difficulty?   yes, "Not as easily as before but I am pretty regular"     Is patient having uncontrolled nausea?  no    patient calls 05/08/2013 with complaint of  Integument positive for "itching all over my entire body that is a nuissance and I am miserable and want to strip.  on my legs there are red spotches.  I was told to try benadryl but this isn't working anymore.  No pain just tingling in my fingers."    Summary Based on the above information advised patient to  Await return call after symptoms reported to provider.  Can reach her at mobile number [phone].   Brandi Warren  05/08/2013, 2:33 PM   Background Info  Brandi Warren   DOB: 1960-06-07   MR#: [id number]   CSN#   [id number] 05/08/2013

## 2013-05-08 NOTE — Telephone Encounter (Signed)
Collaborative nurse has called Dr. Marko Plume, reports has orders and will call patient with Dr. Mariana Kaufman instructions.

## 2013-05-10 ENCOUNTER — Encounter: Payer: Self-pay | Admitting: Oncology

## 2013-05-10 ENCOUNTER — Other Ambulatory Visit (HOSPITAL_BASED_OUTPATIENT_CLINIC_OR_DEPARTMENT_OTHER): Payer: 59

## 2013-05-10 ENCOUNTER — Ambulatory Visit (HOSPITAL_BASED_OUTPATIENT_CLINIC_OR_DEPARTMENT_OTHER): Payer: 59 | Admitting: Oncology

## 2013-05-10 VITALS — BP 108/71 | HR 91 | Temp 98.2°F | Resp 18 | Ht 65.0 in | Wt 131.4 lb

## 2013-05-10 DIAGNOSIS — G40909 Epilepsy, unspecified, not intractable, without status epilepticus: Secondary | ICD-10-CM

## 2013-05-10 DIAGNOSIS — C549 Malignant neoplasm of corpus uteri, unspecified: Secondary | ICD-10-CM

## 2013-05-10 DIAGNOSIS — C541 Malignant neoplasm of endometrium: Secondary | ICD-10-CM

## 2013-05-10 DIAGNOSIS — L299 Pruritus, unspecified: Secondary | ICD-10-CM

## 2013-05-10 DIAGNOSIS — G609 Hereditary and idiopathic neuropathy, unspecified: Secondary | ICD-10-CM

## 2013-05-10 LAB — CBC WITH DIFFERENTIAL/PLATELET
BASO%: 0.1 % (ref 0.0–2.0)
Basophils Absolute: 0 10*3/uL (ref 0.0–0.1)
EOS%: 0 % (ref 0.0–7.0)
Eosinophils Absolute: 0 10*3/uL (ref 0.0–0.5)
HCT: 36 % (ref 34.8–46.6)
HGB: 11.9 g/dL (ref 11.6–15.9)
LYMPH%: 21 % (ref 14.0–49.7)
MCH: 31.3 pg (ref 25.1–34.0)
MCHC: 33 g/dL (ref 31.5–36.0)
MCV: 94.7 fL (ref 79.5–101.0)
MONO#: 0.5 10*3/uL (ref 0.1–0.9)
MONO%: 5.2 % (ref 0.0–14.0)
NEUT%: 73.7 % (ref 38.4–76.8)
NEUTROS ABS: 7.1 10*3/uL — AB (ref 1.5–6.5)
Platelets: 192 10*3/uL (ref 145–400)
RBC: 3.79 10*6/uL (ref 3.70–5.45)
RDW: 16 % — AB (ref 11.2–14.5)
WBC: 9.7 10*3/uL (ref 3.9–10.3)
lymph#: 2 10*3/uL (ref 0.9–3.3)

## 2013-05-10 LAB — COMPREHENSIVE METABOLIC PANEL (CC13)
ALBUMIN: 4.1 g/dL (ref 3.5–5.0)
ALK PHOS: 95 U/L (ref 40–150)
ALT: 13 U/L (ref 0–55)
AST: 16 U/L (ref 5–34)
Anion Gap: 10 mEq/L (ref 3–11)
BUN: 14.6 mg/dL (ref 7.0–26.0)
CO2: 26 mEq/L (ref 22–29)
Calcium: 9.6 mg/dL (ref 8.4–10.4)
Chloride: 103 mEq/L (ref 98–109)
Creatinine: 0.7 mg/dL (ref 0.6–1.1)
Glucose: 91 mg/dl (ref 70–140)
Potassium: 3.9 mEq/L (ref 3.5–5.1)
SODIUM: 140 meq/L (ref 136–145)
TOTAL PROTEIN: 7.3 g/dL (ref 6.4–8.3)

## 2013-05-10 MED ORDER — OXYCODONE-ACETAMINOPHEN 5-325 MG PO TABS: ORAL_TABLET | ORAL | Status: DC

## 2013-05-10 NOTE — Progress Notes (Signed)
OFFICE PROGRESS NOTE   05/10/2013   Physicians:Daniel ClarkePearson,[facility], Peggy Constant, ortho [facility]   INTERVAL HISTORY:  Patient is seen, together with significant other, in continuing attention to adjuvant chemotherapy in process for T1 endometrial carcinoma with positive washings, cycle 2 taxol carboplatin given 04-26-13 with neulasta on 04-29-13. Plan is for 3 cycles of chemotherapy, then scans 06-11-13 prior to visit with Dr Josephina Shih on 06-14-13.  Patient complains of generalized itching beginning prior to 05-06-13, minimally improved with benadryl, zyrtec, aveeno bath; I do not know if she tried OTC hydrocortisone. There has been no rash, no blisters, no mucositis, no fever. She recalls similar itching with elevated dilantin level years ago. She has used some percocet for taxol aches.  Otherwise, she had some minimal intermittent numbness in toes bilaterally after #2 chemo, which has resolved; no significant numbness in fingers. She is eating and drinking fluids, bowels ok, no fever or symptoms of infection, no abdominal or pelvic pain.   She does not have PAC.  ONCOLOGIC HISTORY Patient presented with intermittent abnormal vaginal bleeding x several years, treated with Megace then Provera. She had D&C by Dr Elly Modena 01-31-13 with complex hyperplasia and focal atypia of endometrium, and 4 cm cyst left ovary. She had TAH BSO by Dr Josephina Shih on 02-19-13, with frozen sections negative for invasive malignancy/ no identified malignancy, and no enlarged lymph nodes with benign appearing 3 cm cyst left ovary intraoperatively. Final pathology [id number]) had microscopic grade 1 endometrial cancer and multiple microscopic foci of high grade endometrial cancer and well as positive cytology on washings ([id number]). Post operative course has been uneventful. She saw Dr Josephina Shih on 03-15-13, with recommendation for 3 cycles of carboplatin taxol as would be used in similar IC  ovarian diagnosis. Cycle 1 carbo taxol was given 04-05-13.    REVIEW OF SYSTEMS otherwise: No respiratory or cardiac symptoms. No LE sweling. Remainder of 10 point Review of Systems negative.   Objective:  Vital signs in last 24 hours:  BP 108/71  Pulse 91  Temp(Src) 98.2 F (36.8 C) (Oral)  Resp 18  Ht 5\' 5"  (1.651 m)  Wt 131 lb 6.4 oz (59.603 kg)  BMI 21.87 kg/m2 weight is down 4 lbs.  Alert, oriented and appropriate. Ambulatory without difficulty, otherwise easily mobile. Alopecia. Does not appear in any acute discomfort.  HEENT:PERRL, sclerae not icteric. Oral mucosa moist without lesions, posterior pharynx clear.  Neck supple. No JVD.  Lymphatics:no cervical,suraclavicular, axillary or inguinal adenopathy Resp: clear to auscultation bilaterally and normal percussion bilaterally Cardio: regular rate and rhythm. No gallop. GI: soft, nontender, not distended, no mass or organomegaly. Normally active bowel sounds. Surgical incision not remarkable. Musculoskeletal/ Extremities: without pitting edema, cords, tenderness Neuro: no peripheral neuropathy. Otherwise nonfocal Skin diffusely dry thruout without rash, desquamation, vesicles, bullae, ecchymosis, or petechiae s  Lab Results:  Results for orders placed in visit on 05/10/13  CBC WITH DIFFERENTIAL      Result Value Ref Range   WBC 9.7  3.9 - 10.3 10e3/uL   NEUT# 7.1 (*) 1.5 - 6.5 10e3/uL   HGB 11.9  11.6 - 15.9 g/dL   HCT 36.0  34.8 - 46.6 %   Platelets 192  145 - 400 10e3/uL   MCV 94.7  79.5 - 101.0 fL   MCH 31.3  25.1 - 34.0 pg   MCHC 33.0  31.5 - 36.0 g/dL   RBC 3.79  3.70 - 5.45 10e6/uL   RDW 16.0 (*) 11.2 - 14.5 %  lymph# 2.0  0.9 - 3.3 10e3/uL   MONO# 0.5  0.1 - 0.9 10e3/uL   Eosinophils Absolute 0.0  0.0 - 0.5 10e3/uL   Basophils Absolute 0.0  0.0 - 0.1 10e3/uL   NEUT% 73.7  38.4 - 76.8 %   LYMPH% 21.0  14.0 - 49.7 %   MONO% 5.2  0.0 - 14.0 %   EOS% 0.0  0.0 - 7.0 %   BASO% 0.1  0.0 - 2.0 %   COMPREHENSIVE METABOLIC PANEL (ID78)      Result Value Ref Range   Sodium 140  136 - 145 mEq/L   Potassium 3.9  3.5 - 5.1 mEq/L   Chloride 103  98 - 109 mEq/L   CO2 26  22 - 29 mEq/L   Glucose 91  70 - 140 mg/dl   BUN 14.6  7.0 - 26.0 mg/dL   Creatinine 0.7  0.6 - 1.1 mg/dL   Total Bilirubin <0.20  0.20 - 1.20 mg/dL   Alkaline Phosphatase 95  40 - 150 U/L   AST 16  5 - 34 U/L   ALT 13  0 - 55 U/L   Total Protein 7.3  6.4 - 8.3 g/dL   Albumin 4.1  3.5 - 5.0 g/dL   Calcium 9.6  8.4 - 10.4 mg/dL   Anion Gap 10  3 - 11 mEq/L  have added dilantin level to next labs as this not drawn today.  Studies/Results:  No results found.  Medications: I have reviewed the patient's current medications. Aquaphor samples given, tho any gentle lotion fine.  DISCUSSION: extremely dry skin either significantly contributing to or causing the pruritis. Patient instructed to avoid hot shower/ bath, air dry without toweling and apply large amounts of lotion to entire skin prior to completely drying, then reappy large amounts lotion several times thru day. She may need to DC percocet if this continues. No etiology from labs above apparent. Pruritis not expected with this chemo. Will check dilantin level with next labs as this apparently not done recently by PCP.  Assessment/Plan:  1.T1aNxM0 endometrial carcinoma: multiple microscopic foci of high grade, minimally invasive adenocarcinoma of endometrium with positive cytology from pelvic washings. Post TAH BSO 02-19-2013. Plan 3 cycles of taxol carboplatin, started 04-05-13, due last planned treatment 05-17-13 with neuasta ~ 05-20-13. CT CAP 06-11-13 then follow up with Dr Josephina Shih 06-14-13.  2.complaints of diffuse pruritis: likey related to very dry skin. Plan as above.  3.restlessness with IV benadryl 50 mg premed cycle 1. Tolerated better at 25 mg, with prn ativan.  4.cervical spine surgery including fusion [place] for benign tumor Orthopedic rod RUE   5.no colonoscopy, which we will try to address after chemotherapy completes.  6.remote history of seizures, on dilantin by PCP  7.post cholecystectomy 8. 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   Gordy Levan, MD   05/10/2013, 4:54 PM

## 2013-05-11 ENCOUNTER — Encounter (HOSPITAL_COMMUNITY): Payer: Self-pay | Admitting: Emergency Medicine

## 2013-05-11 ENCOUNTER — Emergency Department (HOSPITAL_COMMUNITY)
Admission: EM | Admit: 2013-05-11 | Discharge: 2013-05-11 | Disposition: A | Discharge status: Home / Self Care | Payer: 59 | Attending: Emergency Medicine | Admitting: Emergency Medicine

## 2013-05-11 DIAGNOSIS — R011 Cardiac murmur, unspecified: Secondary | ICD-10-CM | POA: Insufficient documentation

## 2013-05-11 DIAGNOSIS — G40909 Epilepsy, unspecified, not intractable, without status epilepticus: Secondary | ICD-10-CM | POA: Insufficient documentation

## 2013-05-11 DIAGNOSIS — Z8742 Personal history of other diseases of the female genital tract: Secondary | ICD-10-CM | POA: Insufficient documentation

## 2013-05-11 DIAGNOSIS — C549 Malignant neoplasm of corpus uteri, unspecified: Secondary | ICD-10-CM | POA: Insufficient documentation

## 2013-05-11 DIAGNOSIS — L299 Pruritus, unspecified: Secondary | ICD-10-CM

## 2013-05-11 DIAGNOSIS — Z87891 Personal history of nicotine dependence: Secondary | ICD-10-CM | POA: Insufficient documentation

## 2013-05-11 DIAGNOSIS — M5030 Other cervical disc degeneration, unspecified cervical region: Secondary | ICD-10-CM | POA: Insufficient documentation

## 2013-05-11 DIAGNOSIS — Z79899 Other long term (current) drug therapy: Secondary | ICD-10-CM | POA: Insufficient documentation

## 2013-05-11 MED ORDER — PREDNISONE 20 MG PO TABS: ORAL_TABLET | ORAL | Status: DC

## 2013-05-11 MED ORDER — FAMOTIDINE 20 MG PO TABS
20.0000 mg | ORAL_TABLET | Freq: Once | ORAL | Status: AC
  Administered 2013-05-11: 20 mg via ORAL
  Filled 2013-05-11: qty 1

## 2013-05-11 MED ORDER — PREDNISONE 20 MG PO TABS
60.0000 mg | ORAL_TABLET | Freq: Once | ORAL | Status: AC
  Administered 2013-05-11: 60 mg via ORAL
  Filled 2013-05-11: qty 3

## 2013-05-11 MED ORDER — HYDROXYZINE HCL 25 MG PO TABS
50.0000 mg | ORAL_TABLET | Freq: Once | ORAL | Status: AC
  Administered 2013-05-11: 50 mg via ORAL
  Filled 2013-05-11: qty 2

## 2013-05-11 MED ORDER — FAMOTIDINE 40 MG PO TABS: 40.0000 mg | ORAL_TABLET | Freq: Two times a day (BID) | ORAL | Status: DC

## 2013-05-11 MED ORDER — HYDROXYZINE HCL 25 MG PO TABS: 50.0000 mg | ORAL_TABLET | Freq: Four times a day (QID) | ORAL | Status: DC | PRN
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
## 2013-05-11 NOTE — Discharge Instructions (Signed)
Stay cool, heat will make the rash itch more. Take the medication as prescribed. Return if you have trouble breathing, swallowing or you get worse.    Pruritus  Pruritis is an itch. There are many different problems that can cause an itch. Dry skin is one of the most common causes of itching. Most cases of itching do not require medical attention.  HOME CARE INSTRUCTIONS  Make sure your skin is moistened on a regular basis. A moisturizer that contains petroleum jelly is best for keeping moisture in your skin. If you develop a rash, you may try the following for relief:   Use corticosteroid cream.  Apply cool compresses to the affected areas.  Bathe with Epsom salts or baking soda in the bathwater.  Soak in colloidal oatmeal baths. These are available at your pharmacy.  Apply baking soda paste to the rash. Stir water into baking soda until it reaches a paste-like consistency.  Use an anti-itch lotion.  Take over-the-counter diphenhydramine medicine by mouth as the instructions direct.  Avoid scratching. Scratching may cause the rash to become infected. If itching is very bad, your caregiver may suggest prescription lotions or creams to lessen your symptoms.  Avoid hot showers, which can make itching worse. A cold shower may help with itching as long as you use a moisturizer after the shower. SEEK MEDICAL CARE IF: The itching does not go away after several days. Document Released: 09/15/2010 Document Revised: 03/28/2011 Document Reviewed: 09/15/2010 [facility] Patient Information 2014 [place], [place].

## 2013-05-11 NOTE — ED Notes (Signed)
Pt states she has been itching for 6 days,  Currently under chemo treatment at this time for endometrial cancer.  Pt alert and oriented in NAD

## 2013-05-11 NOTE — ED Notes (Signed)
Pt from home c/o itching and burning all over x1 week. Pt was seen at oncologist yesterday and was told it was dry skin. Pt reports that she has tried topical aids with no relief. Pt states that she is unable to tolerate it any longer. Pt is A&O, has no other c/o. Pt is NAD

## 2013-05-13 ENCOUNTER — Telehealth: Payer: Self-pay

## 2013-05-13 NOTE — Telephone Encounter (Signed)
Told Brandi Warren that Dr. Marko Plume said that it would be fine for her to take 4 tabs of the decadron 4mg  12 and 6 hours prior to her treatment since she use 2 tabs over the weekend trying to relieve her itching.

## 2013-05-15 ENCOUNTER — Telehealth: Payer: Self-pay

## 2013-05-15 NOTE — Telephone Encounter (Signed)
Faxed signed order dated 05-15-13  For hair prosthesis.  Sent a copy to HIM to be scanned into patient's EMR.

## 2013-05-17 ENCOUNTER — Other Ambulatory Visit (HOSPITAL_BASED_OUTPATIENT_CLINIC_OR_DEPARTMENT_OTHER): Payer: 59

## 2013-05-17 ENCOUNTER — Ambulatory Visit (HOSPITAL_BASED_OUTPATIENT_CLINIC_OR_DEPARTMENT_OTHER): Payer: 59

## 2013-05-17 VITALS — BP 119/65 | HR 83 | Temp 98.0°F

## 2013-05-17 DIAGNOSIS — C779 Secondary and unspecified malignant neoplasm of lymph node, unspecified: Secondary | ICD-10-CM

## 2013-05-17 DIAGNOSIS — C549 Malignant neoplasm of corpus uteri, unspecified: Secondary | ICD-10-CM

## 2013-05-17 DIAGNOSIS — C541 Malignant neoplasm of endometrium: Secondary | ICD-10-CM

## 2013-05-17 DIAGNOSIS — Z5111 Encounter for antineoplastic chemotherapy: Secondary | ICD-10-CM

## 2013-05-17 DIAGNOSIS — G40909 Epilepsy, unspecified, not intractable, without status epilepticus: Secondary | ICD-10-CM

## 2013-05-17 DIAGNOSIS — C50919 Malignant neoplasm of unspecified site of unspecified female breast: Secondary | ICD-10-CM

## 2013-05-17 LAB — CBC WITH DIFFERENTIAL/PLATELET
BASO%: 0.1 % (ref 0.0–2.0)
BASOS ABS: 0 10*3/uL (ref 0.0–0.1)
EOS ABS: 0 10*3/uL (ref 0.0–0.5)
EOS%: 0 % (ref 0.0–7.0)
HEMATOCRIT: 38.2 % (ref 34.8–46.6)
HEMOGLOBIN: 12.8 g/dL (ref 11.6–15.9)
LYMPH#: 0.7 10*3/uL — AB (ref 0.9–3.3)
LYMPH%: 8.9 % — ABNORMAL LOW (ref 14.0–49.7)
MCH: 31.5 pg (ref 25.1–34.0)
MCHC: 33.4 g/dL (ref 31.5–36.0)
MCV: 94.2 fL (ref 79.5–101.0)
MONO#: 0.1 10*3/uL (ref 0.1–0.9)
MONO%: 1.9 % (ref 0.0–14.0)
NEUT#: 6.8 10*3/uL — ABNORMAL HIGH (ref 1.5–6.5)
NEUT%: 89.1 % — AB (ref 38.4–76.8)
PLATELETS: 287 10*3/uL (ref 145–400)
RBC: 4.06 10*6/uL (ref 3.70–5.45)
RDW: 17.1 % — ABNORMAL HIGH (ref 11.2–14.5)
WBC: 7.7 10*3/uL (ref 3.9–10.3)

## 2013-05-17 MED ORDER — SODIUM CHLORIDE 0.9 % IV SOLN
Freq: Once | INTRAVENOUS | Status: AC
  Administered 2013-05-17: 11:00:00 via INTRAVENOUS

## 2013-05-17 MED ORDER — PACLITAXEL CHEMO INJECTION 300 MG/50ML
175.0000 mg/m2 | Freq: Once | INTRAVENOUS | Status: AC
  Administered 2013-05-17: 288 mg via INTRAVENOUS
  Filled 2013-05-17: qty 48

## 2013-05-17 MED ORDER — DIPHENHYDRAMINE HCL 50 MG/ML IJ SOLN
INTRAMUSCULAR | Status: AC
  Filled 2013-05-17: qty 1

## 2013-05-17 MED ORDER — ONDANSETRON 16 MG/50ML IVPB (CHCC)
16.0000 mg | Freq: Once | INTRAVENOUS | Status: AC
  Administered 2013-05-17: 16 mg via INTRAVENOUS

## 2013-05-17 MED ORDER — DEXAMETHASONE SODIUM PHOSPHATE 20 MG/5ML IJ SOLN
20.0000 mg | Freq: Once | INTRAMUSCULAR | Status: AC
  Administered 2013-05-17: 20 mg via INTRAVENOUS

## 2013-05-17 MED ORDER — FAMOTIDINE IN NACL 20-0.9 MG/50ML-% IV SOLN
20.0000 mg | Freq: Once | INTRAVENOUS | Status: AC
  Administered 2013-05-17: 20 mg via INTRAVENOUS

## 2013-05-17 MED ORDER — FAMOTIDINE IN NACL 20-0.9 MG/50ML-% IV SOLN
INTRAVENOUS | Status: AC
  Filled 2013-05-17: qty 50

## 2013-05-17 MED ORDER — SODIUM CHLORIDE 0.9 % IV SOLN
580.0000 mg | Freq: Once | INTRAVENOUS | Status: AC
  Administered 2013-05-17: 580 mg via INTRAVENOUS
  Filled 2013-05-17: qty 58

## 2013-05-17 MED ORDER — LORAZEPAM 2 MG/ML IJ SOLN
0.5000 mg | Freq: Once | INTRAMUSCULAR | Status: AC | PRN
  Administered 2013-05-17: 0.5 mg via INTRAVENOUS

## 2013-05-17 MED ORDER — ONDANSETRON 16 MG/50ML IVPB (CHCC)
INTRAVENOUS | Status: AC
  Filled 2013-05-17: qty 16

## 2013-05-17 MED ORDER — LORAZEPAM 2 MG/ML IJ SOLN
INTRAMUSCULAR | Status: AC
  Filled 2013-05-17: qty 1

## 2013-05-17 MED ORDER — DEXAMETHASONE SODIUM PHOSPHATE 20 MG/5ML IJ SOLN
INTRAMUSCULAR | Status: AC
  Filled 2013-05-17: qty 5

## 2013-05-17 MED ORDER — DIPHENHYDRAMINE HCL 50 MG/ML IJ SOLN
25.0000 mg | Freq: Once | INTRAMUSCULAR | Status: AC
  Administered 2013-05-17: 25 mg via INTRAVENOUS

## 2013-05-17 NOTE — Progress Notes (Signed)
CMET done on 05/10/13.  CMET was no ordered for today.  Consulted with Lovett Sox, PharmD.  CMET within 7 days (05/10/13) is ok to use for tx 05/17/13.  SLJ

## 2013-05-17 NOTE — Patient Instructions (Signed)
[facility] Discharge Instructions for Patients Receiving Chemotherapy  Today you received the following chemotherapy agents taxol, carboplatin  To help prevent nausea and vomiting after your treatment, we encourage you to take your nausea medication as needed   If you develop nausea and vomiting that is not controlled by your nausea medication, call the clinic.   BELOW ARE SYMPTOMS THAT SHOULD BE REPORTED IMMEDIATELY:  *FEVER GREATER THAN 100.5 F  *CHILLS WITH OR WITHOUT FEVER  NAUSEA AND VOMITING THAT IS NOT CONTROLLED WITH YOUR NAUSEA MEDICATION  *UNUSUAL SHORTNESS OF BREATH  *UNUSUAL BRUISING OR BLEEDING  TENDERNESS IN MOUTH AND THROAT WITH OR WITHOUT PRESENCE OF ULCERS  *URINARY PROBLEMS  *BOWEL PROBLEMS  UNUSUAL RASH Items with * indicate a potential emergency and should be followed up as soon as possible.  Feel free to call the clinic you have any questions or concerns. The clinic phone number is [phone].    

## 2013-05-19 ENCOUNTER — Other Ambulatory Visit: Payer: Self-pay | Admitting: Oncology

## 2013-05-19 DIAGNOSIS — C541 Malignant neoplasm of endometrium: Secondary | ICD-10-CM

## 2013-05-20 ENCOUNTER — Other Ambulatory Visit (HOSPITAL_BASED_OUTPATIENT_CLINIC_OR_DEPARTMENT_OTHER): Payer: 59

## 2013-05-20 ENCOUNTER — Ambulatory Visit (HOSPITAL_BASED_OUTPATIENT_CLINIC_OR_DEPARTMENT_OTHER): Payer: 59

## 2013-05-20 ENCOUNTER — Ambulatory Visit (HOSPITAL_BASED_OUTPATIENT_CLINIC_OR_DEPARTMENT_OTHER): Payer: 59 | Admitting: Oncology

## 2013-05-20 ENCOUNTER — Telehealth: Payer: Self-pay | Admitting: Oncology

## 2013-05-20 VITALS — BP 118/77 | HR 90 | Temp 98.0°F | Resp 18 | Ht 65.0 in | Wt 134.5 lb

## 2013-05-20 DIAGNOSIS — Z5189 Encounter for other specified aftercare: Secondary | ICD-10-CM

## 2013-05-20 DIAGNOSIS — C549 Malignant neoplasm of corpus uteri, unspecified: Secondary | ICD-10-CM

## 2013-05-20 DIAGNOSIS — C541 Malignant neoplasm of endometrium: Secondary | ICD-10-CM

## 2013-05-20 DIAGNOSIS — C50919 Malignant neoplasm of unspecified site of unspecified female breast: Secondary | ICD-10-CM

## 2013-05-20 DIAGNOSIS — C779 Secondary and unspecified malignant neoplasm of lymph node, unspecified: Secondary | ICD-10-CM

## 2013-05-20 DIAGNOSIS — R569 Unspecified convulsions: Secondary | ICD-10-CM

## 2013-05-20 LAB — CBC WITH DIFFERENTIAL/PLATELET
BASO%: 0.4 % (ref 0.0–2.0)
Basophils Absolute: 0 10*3/uL (ref 0.0–0.1)
EOS%: 0 % (ref 0.0–7.0)
Eosinophils Absolute: 0 10*3/uL (ref 0.0–0.5)
HCT: 37.1 % (ref 34.8–46.6)
HGB: 12.5 g/dL (ref 11.6–15.9)
LYMPH#: 2.4 10*3/uL (ref 0.9–3.3)
LYMPH%: 32.7 % (ref 14.0–49.7)
MCH: 31.7 pg (ref 25.1–34.0)
MCHC: 33.7 g/dL (ref 31.5–36.0)
MCV: 94.2 fL (ref 79.5–101.0)
MONO#: 0.2 10*3/uL (ref 0.1–0.9)
MONO%: 3.1 % (ref 0.0–14.0)
NEUT#: 4.6 10*3/uL (ref 1.5–6.5)
NEUT%: 63.8 % (ref 38.4–76.8)
Platelets: 248 10*3/uL (ref 145–400)
RBC: 3.94 10*6/uL (ref 3.70–5.45)
RDW: 16.8 % — ABNORMAL HIGH (ref 11.2–14.5)
WBC: 7.2 10*3/uL (ref 3.9–10.3)
nRBC: 0 % (ref 0–0)

## 2013-05-20 LAB — PHENYTOIN LEVEL, TOTAL: Phenytoin Lvl: 11.8 ug/mL (ref 10.0–20.0)

## 2013-05-20 MED ORDER — FAMOTIDINE 40 MG PO TABS: 40.0000 mg | ORAL_TABLET | Freq: Two times a day (BID) | ORAL | Status: DC

## 2013-05-20 MED ORDER — PEGFILGRASTIM INJECTION 6 MG/0.6ML
6.0000 mg | Freq: Once | SUBCUTANEOUS | Status: AC
  Administered 2013-05-20: 6 mg via SUBCUTANEOUS
  Filled 2013-05-20: qty 0.6

## 2013-05-20 MED ORDER — HYDROXYZINE HCL 25 MG PO TABS: 50.0000 mg | ORAL_TABLET | Freq: Four times a day (QID) | ORAL | Status: DC | PRN

## 2013-05-20 NOTE — Telephone Encounter (Signed)
, °

## 2013-05-21 ENCOUNTER — Telehealth: Payer: Self-pay

## 2013-05-21 ENCOUNTER — Encounter: Payer: Self-pay | Admitting: Oncology

## 2013-05-21 NOTE — Telephone Encounter (Signed)
Told Brandi Warren her dilantin level as noted below by Dr. Marko Plume.

## 2013-05-21 NOTE — Telephone Encounter (Signed)
Message copied by Baruch Merl on Tue May 21, 2013 12:33 PM ------      Message from: Gordy Levan      Created: Tue May 21, 2013 12:20 PM       Labs seen and need follow up please let her know dilantin level therapeutic ------

## 2013-05-21 NOTE — Progress Notes (Signed)
OFFICE PROGRESS NOTE   05/20/2013  Physicians:Daniel ClarkePearson,[facility], Peggy Constant, ortho [facility]   INTERVAL HISTORY:  Patient is seen, alone for visit, in follow up of adjuvant chemotherapy with taxol and carboplatin given for T1 endometrial cancer with positive washings. She had 3 cycles of this chemotherapy from 04-05-13 thru 05-17-13, which she tolerated generally well, did need gCSF support. She is for repeat CT AP on 06-11-13 and will see Dr Josephina Shih on 06-14-13. She is completing a steroid taper from ED for generalized pruritis, which seemed to me to be from dry skin. Itching is better with lotion and steroids, now mostly on heels bilaterally, which may be with slight taxol neuropathy. She has never had rash.  Otherwise she has taste changes but no marked nausea or vomiting. Bowels are moving. Taxol aches otherwise have resolved.  She does not have [place] Patient presented with intermittent abnormal vaginal bleeding x several years, treated with Megace then Provera. She had D&C by Dr Elly Modena 01-31-13 with complex hyperplasia and focal atypia of endometrium, and 4 cm cyst left ovary. She had TAH BSO by Dr Josephina Shih on 02-19-13, with frozen sections negative for invasive malignancy/ no identified malignancy, and no enlarged lymph nodes with benign appearing 3 cm cyst left ovary intraoperatively. Final pathology [id number]) had microscopic grade 1 endometrial cancer and multiple microscopic foci of high grade endometrial cancer and well as positive cytology on washings ([id number]). Post operative course has been uneventful. She saw Dr Josephina Shih on 03-15-13, with recommendation for 3 cycles of carboplatin taxol as would be used in similar IC ovarian diagnosis. Cycle 1 carbo taxol was given 04-05-13.  Review of systems as above, also: No fever. No mucositis. No abdominal or pelvic pain. No LE swelling. Remainder of 10 point Review of Systems  negative.  Objective:  Vital signs in last 24 hours:  BP 118/77  Pulse 90  Temp(Src) 98 F (36.7 C) (Oral)  Resp 18  Ht 5\' 5"  (1.651 m)  Wt 134 lb 8 oz (61.009 kg)  BMI 22.38 kg/m2  SpO2 99%  Alert, oriented and appropriate. Ambulatory without difficulty.  Alopecia  HEENT:PERRL, sclerae not icteric. Oral mucosa moist without lesions, posterior pharynx clear.  Neck supple. No JVD.  Lymphatics:no cervical,suraclavicular, axillary or inguinal adenopathy Resp: clear to auscultation bilaterally and normal percussion bilaterally Cardio: regular rate and rhythm. No gallop. GI: soft, nontender, not distended, no mass or organomegaly. Normally active bowel sounds. Surgical incision not remarkable. Musculoskeletal/ Extremities: without pitting edema, cords, tenderness Neuro: no other peripheral neuropathy. Otherwise nonfocal Skin still dry on back, otherwise looks better, without rash, ecchymosis, petechiae   Lab Results:  Results for orders placed in visit on 05/20/13  CBC WITH DIFFERENTIAL      Result Value Ref Range   WBC 7.2  3.9 - 10.3 10e3/uL   NEUT# 4.6  1.5 - 6.5 10e3/uL   HGB 12.5  11.6 - 15.9 g/dL   HCT 37.1  34.8 - 46.6 %   Platelets 248  145 - 400 10e3/uL   MCV 94.2  79.5 - 101.0 fL   MCH 31.7  25.1 - 34.0 pg   MCHC 33.7  31.5 - 36.0 g/dL   RBC 3.94  3.70 - 5.45 10e6/uL   RDW 16.8 (*) 11.2 - 14.5 %   lymph# 2.4  0.9 - 3.3 10e3/uL   MONO# 0.2  0.1 - 0.9 10e3/uL   Eosinophils Absolute 0.0  0.0 - 0.5 10e3/uL   Basophils Absolute  0.0  0.0 - 0.1 10e3/uL   NEUT% 63.8  38.4 - 76.8 %   LYMPH% 32.7  14.0 - 49.7 %   MONO% 3.1  0.0 - 14.0 %   EOS% 0.0  0.0 - 7.0 %   BASO% 0.4  0.0 - 2.0 %   nRBC 0  0 - 0 %    Dilantin level available after visit in therapeutic range at 11.8 Studies/Results:  No results found. CT AP pending 06-11-13  Medications: I have reviewed the patient's current medications.She understands that she may be tired after stopping the steroid taper.  Neulasta given today.    Assessment/Plan:  1.T1aNxM0 endometrial carcinoma: multiple microscopic foci of high grade, minimally invasive adenocarcinoma of endometrium with positive cytology from pelvic washings. Post TAH BSO 02-19-2013 followed by 3 cycles of taxol carboplatin completed 05-17-13.  CT CAP 06-11-13 then follow up with Dr Josephina Shih 06-14-13. Will recheck CBC coordinating with one of these upcoming appointments. I will see her with lab ~ July, but can move that visit if needed according to restaging information 2.diffuse pruritis: likey related to very dry skin. Improved with lotion and on steroid taper by ED  3.restlessness with IV benadryl 50 mg premed cycle 1. Tolerated better at 25 mg, with prn ativan.  4.cervical spine surgery including fusion [place] for benign tumor Orthopedic rod RUE  5.no colonoscopy, which we will try to address after chemotherapy completes.  6.remote history of seizures, on dilantin by PCP . Level therapeutic today 7.post cholecystectomy  8. 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    Patient is in agreement with plan above.   Gordy Levan, MD

## 2013-05-30 ENCOUNTER — Other Ambulatory Visit: Payer: Self-pay | Admitting: *Deleted

## 2013-05-30 DIAGNOSIS — C541 Malignant neoplasm of endometrium: Secondary | ICD-10-CM

## 2013-05-30 MED ORDER — HYDROXYZINE HCL 25 MG PO TABS: 50.0000 mg | ORAL_TABLET | Freq: Four times a day (QID) | ORAL | Status: DC | PRN

## 2013-06-06 ENCOUNTER — Other Ambulatory Visit: Payer: Self-pay | Admitting: Family Medicine

## 2013-06-07 ENCOUNTER — Other Ambulatory Visit: Payer: Self-pay | Admitting: *Deleted

## 2013-06-11 ENCOUNTER — Other Ambulatory Visit (HOSPITAL_BASED_OUTPATIENT_CLINIC_OR_DEPARTMENT_OTHER): Payer: 59

## 2013-06-11 ENCOUNTER — Ambulatory Visit (HOSPITAL_COMMUNITY)
Admission: RE | Admit: 2013-06-11 | Discharge: 2013-06-11 | Disposition: A | Discharge status: Home / Self Care | Payer: 59 | Source: Ambulatory Visit | Attending: Gynecologic Oncology | Admitting: Gynecologic Oncology

## 2013-06-11 ENCOUNTER — Encounter (HOSPITAL_COMMUNITY): Payer: Self-pay

## 2013-06-11 ENCOUNTER — Ambulatory Visit (HOSPITAL_COMMUNITY): Payer: 59

## 2013-06-11 DIAGNOSIS — C541 Malignant neoplasm of endometrium: Secondary | ICD-10-CM

## 2013-06-11 DIAGNOSIS — C549 Malignant neoplasm of corpus uteri, unspecified: Secondary | ICD-10-CM

## 2013-06-11 DIAGNOSIS — Z923 Personal history of irradiation: Secondary | ICD-10-CM | POA: Insufficient documentation

## 2013-06-11 DIAGNOSIS — Z90710 Acquired absence of both cervix and uterus: Secondary | ICD-10-CM | POA: Insufficient documentation

## 2013-06-11 LAB — COMPREHENSIVE METABOLIC PANEL (CC13)
ALT: 12 U/L (ref 0–55)
ANION GAP: 9 meq/L (ref 3–11)
AST: 17 U/L (ref 5–34)
Albumin: 3.7 g/dL (ref 3.5–5.0)
Alkaline Phosphatase: 71 U/L (ref 40–150)
BUN: 17.7 mg/dL (ref 7.0–26.0)
CO2: 25 meq/L (ref 22–29)
Calcium: 8.9 mg/dL (ref 8.4–10.4)
Chloride: 103 mEq/L (ref 98–109)
Creatinine: 0.7 mg/dL (ref 0.6–1.1)
GLUCOSE: 83 mg/dL (ref 70–140)
POTASSIUM: 4 meq/L (ref 3.5–5.1)
SODIUM: 137 meq/L (ref 136–145)
TOTAL PROTEIN: 6.8 g/dL (ref 6.4–8.3)
Total Bilirubin: 0.23 mg/dL (ref 0.20–1.20)

## 2013-06-11 LAB — CBC WITH DIFFERENTIAL/PLATELET
BASO%: 0.4 % (ref 0.0–2.0)
BASOS ABS: 0 10*3/uL (ref 0.0–0.1)
EOS ABS: 0 10*3/uL (ref 0.0–0.5)
EOS%: 0 % (ref 0.0–7.0)
HCT: 36.8 % (ref 34.8–46.6)
HEMOGLOBIN: 12.1 g/dL (ref 11.6–15.9)
LYMPH%: 26.4 % (ref 14.0–49.7)
MCH: 31.8 pg (ref 25.1–34.0)
MCHC: 32.9 g/dL (ref 31.5–36.0)
MCV: 96.6 fL (ref 79.5–101.0)
MONO#: 0.4 10*3/uL (ref 0.1–0.9)
MONO%: 6.6 % (ref 0.0–14.0)
NEUT%: 66.6 % (ref 38.4–76.8)
NEUTROS ABS: 3.9 10*3/uL (ref 1.5–6.5)
Platelets: 210 10*3/uL (ref 145–400)
RBC: 3.81 10*6/uL (ref 3.70–5.45)
RDW: 18.3 % — AB (ref 11.2–14.5)
WBC: 5.9 10*3/uL (ref 3.9–10.3)
lymph#: 1.6 10*3/uL (ref 0.9–3.3)

## 2013-06-11 MED ORDER — IOHEXOL 300 MG/ML  SOLN
100.0000 mL | Freq: Once | INTRAMUSCULAR | Status: AC | PRN
  Administered 2013-06-11: 100 mL via INTRAVENOUS

## 2013-06-12 LAB — CA 125: CA 125: 4.5 U/mL (ref 0.0–30.2)

## 2013-06-14 ENCOUNTER — Ambulatory Visit: Payer: 59 | Attending: Gynecology | Admitting: Gynecology

## 2013-06-14 ENCOUNTER — Encounter: Payer: Self-pay | Admitting: Gynecology

## 2013-06-14 VITALS — BP 130/82 | HR 76 | Temp 98.5°F | Resp 16 | Ht 65.0 in | Wt 137.7 lb

## 2013-06-14 DIAGNOSIS — IMO0002 Reserved for concepts with insufficient information to code with codable children: Secondary | ICD-10-CM | POA: Insufficient documentation

## 2013-06-14 DIAGNOSIS — M5030 Other cervical disc degeneration, unspecified cervical region: Secondary | ICD-10-CM | POA: Insufficient documentation

## 2013-06-14 DIAGNOSIS — Z90710 Acquired absence of both cervix and uterus: Secondary | ICD-10-CM | POA: Insufficient documentation

## 2013-06-14 DIAGNOSIS — G609 Hereditary and idiopathic neuropathy, unspecified: Secondary | ICD-10-CM | POA: Insufficient documentation

## 2013-06-14 DIAGNOSIS — N83209 Unspecified ovarian cyst, unspecified side: Secondary | ICD-10-CM | POA: Insufficient documentation

## 2013-06-14 DIAGNOSIS — C541 Malignant neoplasm of endometrium: Secondary | ICD-10-CM

## 2013-06-14 DIAGNOSIS — Z79899 Other long term (current) drug therapy: Secondary | ICD-10-CM | POA: Insufficient documentation

## 2013-06-14 DIAGNOSIS — Z9221 Personal history of antineoplastic chemotherapy: Secondary | ICD-10-CM | POA: Insufficient documentation

## 2013-06-14 DIAGNOSIS — C549 Malignant neoplasm of corpus uteri, unspecified: Secondary | ICD-10-CM | POA: Insufficient documentation

## 2013-06-14 NOTE — Patient Instructions (Signed)
Please avoid intercourse for one week. Return to see me in 3 months. Please try Astro glide for lubrication.
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
## 2013-06-27 ENCOUNTER — Other Ambulatory Visit: Payer: Self-pay | Admitting: *Deleted

## 2013-06-27 MED ORDER — VENLAFAXINE HCL ER 75 MG PO CP24: 75.0000 mg | ORAL_CAPSULE | Freq: Every day | ORAL | Status: DC

## 2013-06-27 NOTE — Telephone Encounter (Signed)
Pt called states" I'm having hot flashes, wake up a lot at night soaking wet, cant sleep. Reviewed with Dr. Wayland Salinas. Rx for Effexor 75mg  q HS Q#30 R-1 sent to pt pharmacy. Pt will f/u 8/22. Pt verbalized understanding.

## 2013-06-28 ENCOUNTER — Telehealth: Payer: Self-pay | Admitting: *Deleted

## 2013-06-28 NOTE — Telephone Encounter (Signed)
Rx for Effexor called into pt pharmacy

## 2013-07-11 ENCOUNTER — Telehealth: Payer: Self-pay | Admitting: *Deleted

## 2013-07-11 NOTE — Telephone Encounter (Signed)
Notified pt of future appointments with the lab and Dr. Marko Plume on 07/31/12. Pt agreed with time and date

## 2013-07-28 ENCOUNTER — Other Ambulatory Visit: Payer: Self-pay | Admitting: Oncology

## 2013-07-31 ENCOUNTER — Other Ambulatory Visit: Payer: Self-pay | Admitting: *Deleted

## 2013-07-31 ENCOUNTER — Ambulatory Visit (HOSPITAL_BASED_OUTPATIENT_CLINIC_OR_DEPARTMENT_OTHER): Payer: 59 | Admitting: Oncology

## 2013-07-31 ENCOUNTER — Ambulatory Visit (HOSPITAL_BASED_OUTPATIENT_CLINIC_OR_DEPARTMENT_OTHER): Payer: 59

## 2013-07-31 ENCOUNTER — Encounter: Payer: Self-pay | Admitting: Oncology

## 2013-07-31 ENCOUNTER — Other Ambulatory Visit (HOSPITAL_BASED_OUTPATIENT_CLINIC_OR_DEPARTMENT_OTHER): Payer: 59

## 2013-07-31 VITALS — BP 110/72 | HR 74 | Temp 98.3°F | Resp 20 | Ht 65.0 in | Wt 138.0 lb

## 2013-07-31 DIAGNOSIS — R197 Diarrhea, unspecified: Secondary | ICD-10-CM

## 2013-07-31 DIAGNOSIS — R350 Frequency of micturition: Secondary | ICD-10-CM

## 2013-07-31 DIAGNOSIS — K3189 Other diseases of stomach and duodenum: Secondary | ICD-10-CM

## 2013-07-31 DIAGNOSIS — R1013 Epigastric pain: Secondary | ICD-10-CM

## 2013-07-31 DIAGNOSIS — C549 Malignant neoplasm of corpus uteri, unspecified: Secondary | ICD-10-CM

## 2013-07-31 DIAGNOSIS — C541 Malignant neoplasm of endometrium: Secondary | ICD-10-CM

## 2013-07-31 DIAGNOSIS — N390 Urinary tract infection, site not specified: Secondary | ICD-10-CM

## 2013-07-31 LAB — CBC WITH DIFFERENTIAL/PLATELET
BASO%: 0.8 % (ref 0.0–2.0)
Basophils Absolute: 0 10*3/uL (ref 0.0–0.1)
EOS%: 0 % (ref 0.0–7.0)
Eosinophils Absolute: 0 10*3/uL (ref 0.0–0.5)
HCT: 39.2 % (ref 34.8–46.6)
HGB: 13.1 g/dL (ref 11.6–15.9)
LYMPH%: 35.4 % (ref 14.0–49.7)
MCH: 32.7 pg (ref 25.1–34.0)
MCHC: 33.4 g/dL (ref 31.5–36.0)
MCV: 98.1 fL (ref 79.5–101.0)
MONO#: 0.4 10*3/uL (ref 0.1–0.9)
MONO%: 8.3 % (ref 0.0–14.0)
NEUT#: 2.7 10*3/uL (ref 1.5–6.5)
NEUT%: 55.5 % (ref 38.4–76.8)
Platelets: 191 10*3/uL (ref 145–400)
RBC: 4 10*6/uL (ref 3.70–5.45)
RDW: 11.9 % (ref 11.2–14.5)
WBC: 4.8 10*3/uL (ref 3.9–10.3)
lymph#: 1.7 10*3/uL (ref 0.9–3.3)

## 2013-07-31 LAB — URINALYSIS, MICROSCOPIC - CHCC
Bilirubin (Urine): NEGATIVE
Glucose: NEGATIVE mg/dL
KETONES: NEGATIVE mg/dL
Nitrite: NEGATIVE
PH: 7.5 (ref 4.6–8.0)
Protein: NEGATIVE mg/dL
SPECIFIC GRAVITY, URINE: 1.005 (ref 1.003–1.035)
Urobilinogen, UR: 0.2 mg/dL (ref 0.2–1)

## 2013-07-31 LAB — COMPREHENSIVE METABOLIC PANEL (CC13)
ALK PHOS: 59 U/L (ref 40–150)
ALT: 13 U/L (ref 0–55)
ANION GAP: 10 meq/L (ref 3–11)
AST: 16 U/L (ref 5–34)
Albumin: 3.9 g/dL (ref 3.5–5.0)
BILIRUBIN TOTAL: 0.33 mg/dL (ref 0.20–1.20)
BUN: 13.8 mg/dL (ref 7.0–26.0)
CO2: 24 meq/L (ref 22–29)
Calcium: 9.6 mg/dL (ref 8.4–10.4)
Chloride: 105 mEq/L (ref 98–109)
Creatinine: 0.8 mg/dL (ref 0.6–1.1)
Glucose: 88 mg/dl (ref 70–140)
Potassium: 4 mEq/L (ref 3.5–5.1)
SODIUM: 139 meq/L (ref 136–145)
TOTAL PROTEIN: 7.1 g/dL (ref 6.4–8.3)

## 2013-07-31 LAB — CLOSTRIDIUM DIFFICILE BY PCR: Toxigenic C. Difficile by PCR: NEGATIVE

## 2013-07-31 MED ORDER — PHENYTOIN SODIUM EXTENDED 100 MG PO CAPS
ORAL_CAPSULE | ORAL | Status: DC
Start: 2013-07-31 — End: 2015-10-09

## 2013-07-31 NOTE — Progress Notes (Signed)
OFFICE PROGRESS NOTE   07/31/2013   Physicians:Daniel ClarkePearson,[facility], Peggy Constant, ortho [place] She is establishing with different PCP, does not know name.  INTERVAL HISTORY:  Patient is seen, alone for visit, in continuing attention to grade 1 endometrial carcinoma with positive washings, treated with adjuvant chemotherapy after surgery. She is on observation since chemotherapy completed on 05-17-13. She had restaging CT AP on 06-11-13 and saw Dr Josephina Shih after that scan; she is to see him back in August.  Patient has felt progressively better since completing treatment, back to all regular activities. She had some diarrhea over past weekend and still feels bowels not quite normal; her father was recently in SNF and hospital, diagnosed with C.diff, and patient was with him. She mentions urinary frequency since surgery, no dysuria, not changed. She has gained some weight, but is no longer walking 2 miles daily as she had done prior to this diagnosis. Hot flashes were not improved with Effexor, which she disliked and stopped) or black cohosh, and she prefers to manage these without medication for now.  She does not have PAC.  ONCOLOGIC HISTORY Patient presented with intermittent abnormal vaginal bleeding x several years, treated with Megace then Provera. She had D&C by Dr Elly Modena 01-31-13 with complex hyperplasia and focal atypia of endometrium, and 4 cm cyst left ovary. She had TAH BSO by Dr Josephina Shih on 02-19-13, with frozen sections negative for invasive malignancy/ no identified malignancy, and no enlarged lymph nodes with benign appearing 3 cm cyst left ovary intraoperatively. Final pathology [id number]) had microscopic grade 1 endometrial cancer and multiple microscopic foci of high grade endometrial cancer and well as positive cytology on washings ([id number]). Post operative course has been uneventful. She saw Dr Josephina Shih on 03-15-13, with recommendation  for 3 cycles of carboplatin taxol as would be used in similar IC ovarian diagnosis. She had 3 cycles of adjuvant carbo taxol from 04-05-13 thru 05-17-13.  Review of systems as above, also: Hair is growing back. Some sinus congestion since she stopped allergy med. No lower respiratory complaints. Bowels had been regular prior to recent diarrhea. No bleeding. Appetite good. No swelling LE. Still some soreness in legs at hs especially. Remainder of 10 point Review of Systems negative.  Objective:  Vital signs in last 24 hours:  BP 110/72  Pulse 74  Temp(Src) 98.3 F (36.8 C) (Oral)  Resp 20  Ht 5\' 5"  (1.651 m)  Wt 138 lb (62.596 kg)  BMI 22.96 kg/m2  LMP 01/23/2013 weight is up 3.5 lbs from early May.  Alert, oriented and appropriate. Ambulatory without difficulty, looks comfortable. Wearing high heel sandals. Still wearing wig but hair is growing back.  HEENT:PERRL, sclerae not icteric. Oral mucosa moist without lesions, posterior pharynx clear.  Neck supple. No JVD.  Lymphatics:no cervical,suraclavicular, axillary or inguinal adenopathy Resp: clear to auscultation bilaterally and normal percussion bilaterally Cardio: regular rate and rhythm. No gallop. GI: soft, nontender, not distended, no mass or organomegaly. Normally active bowel sounds. Surgical incision not remarkable. Musculoskeletal/ Extremities: without pitting edema, cords, tenderness Neuro: no peripheral neuropathy. Otherwise nonfocal Skin without rash, ecchymosis, petechiae    Lab Results:  Results for orders placed in visit on 07/31/13  CBC WITH DIFFERENTIAL      Result Value Ref Range   WBC 4.8  3.9 - 10.3 10e3/uL   NEUT# 2.7  1.5 - 6.5 10e3/uL   HGB 13.1  11.6 - 15.9 g/dL   HCT 39.2  34.8 - 46.6 %  Platelets 191  145 - 400 10e3/uL   MCV 98.1  79.5 - 101.0 fL   MCH 32.7  25.1 - 34.0 pg   MCHC 33.4  31.5 - 36.0 g/dL   RBC 4.00  3.70 - 5.45 10e6/uL   RDW 11.9  11.2 - 14.5 %   lymph# 1.7  0.9 - 3.3 10e3/uL    MONO# 0.4  0.1 - 0.9 10e3/uL   Eosinophils Absolute 0.0  0.0 - 0.5 10e3/uL   Basophils Absolute 0.0  0.0 - 0.1 10e3/uL   NEUT% 55.5  38.4 - 76.8 %   LYMPH% 35.4  14.0 - 49.7 %   MONO% 8.3  0.0 - 14.0 %   EOS% 0.0  0.0 - 7.0 %   BASO% 0.8  0.0 - 2.0 %  COMPREHENSIVE METABOLIC PANEL (FI43)      Result Value Ref Range   Sodium 139  136 - 145 mEq/L   Potassium 4.0  3.5 - 5.1 mEq/L   Chloride 105  98 - 109 mEq/L   CO2 24  22 - 29 mEq/L   Glucose 88  70 - 140 mg/dl   BUN 13.8  7.0 - 26.0 mg/dL   Creatinine 0.8  0.6 - 1.1 mg/dL   Total Bilirubin 0.33  0.20 - 1.20 mg/dL   Alkaline Phosphatase 59  40 - 150 U/L   AST 16  5 - 34 U/L   ALT 13  0 - 55 U/L   Total Protein 7.1  6.4 - 8.3 g/dL   Albumin 3.9  3.5 - 5.0 g/dL   Calcium 9.6  8.4 - 10.4 mg/dL   Anion Gap 10  3 - 11 mEq/L    C.diff sent and pending UA  C&S sent and pending  Studies/Results: CTs 06-11-13: CT CHEST FINDINGS  Lungs are clear. No focal airspace opacities or suspicious nodules.  No effusions. Heart is normal size. Aorta is normal caliber. No  mediastinal, hilar, or axillary adenopathy. Chest wall soft tissues  are unremarkable.  No acute bony abnormality or focal bone lesion.  CT ABDOMEN AND PELVIS FINDINGS  Small low-density lesion noted posteriorly in the right hepatic dome  measures 9 mm. This does not appear to be cystic. This could reflect  a small hemangioma. Recommend attention on followup imaging. Liver  otherwise unremarkable. Prior cholecystectomy. Spleen, pancreas,  adrenals and kidneys are unremarkable.  Moderate stool burden throughout the colon. Stomach and small bowel  are decompressed and grossly unremarkable. Aorta is normal caliber.  No retroperitoneal or mesenteric adenopathy.  Prior hysterectomy. No adnexal masses. Urinary bladder is  decompressed, grossly unremarkable.  IMPRESSION:  Prior hysterectomy. No evidence of locally recurrent disease or  convincing evidence of metastatic disease  within the chest, abdomen  or pelvis.  Small low-density lesion posteriorly within the right hepatic dome.  I favor this represents a small hemangioma. Recommend attention to  this area on followup imaging or further characterization with  ultrasound.   Medications: I have reviewed the patient's current medications. She has had to reschedule new PCP and needs 1 month of chronic dilantin. Will refill only for 1 month, further from PCP. She has requested percocet for leg soreness at hx, which I have not given.   DISCUSSION: Will follow up C diff and urine results pending, to be sure nothing else that is a problem. She will try to increase exercise/ resume walking. She is pushing fluids and keeping house cool/ using fan for hot flashes.  Assessment/Plan:  1.T1aNxM0  endometrial carcinoma: multiple microscopic foci of high grade, minimally invasive adenocarcinoma of endometrium with positive cytology from pelvic washings. Post TAH BSO 02-19-2013 and 3 cycles taxol carboplatin from 04-05-13 thru 05-17-13. She will see Dr Josephina Shih as scheduled 09-06-13 and I will see her at least in 6 months + lab, or sooner if needed.  2.GI upset over last few days with close contact with family member with C diff. C diff specimen obtained and pending. 3.restlessness with IV benadryl 50 mg premed cycle 1. Tolerated better at 25 mg, with prn ativan.  4.urinary frequency: will check specimen to R/O UTI 5.no colonoscopy, which she still needs to have set up.  6.remote history of seizures, on dilantin by PCP  7.post cholecystectomy  8. Minimal peripheral neuropathy  9.diffuse pruritis: likey related to very dry skin, resolved 10.cervical spine surgery including fusion [place] for benign tumor Orthopedic rod RUE   Patient had all questions answered and is in agreement with plan.   ADDENDUM: C diff resulted negative by PCR and urine does not look infected. Will let patient know.  Time spent 25 min including >50%  counseling and coordination of care Ardis Fullwood P, MD   07/31/2013, 9:52 AM

## 2013-08-01 ENCOUNTER — Telehealth: Payer: Self-pay

## 2013-08-01 LAB — URINE CULTURE

## 2013-08-01 NOTE — Telephone Encounter (Signed)
Spoke with Ms. Rushlow and told her the results of the urine and stool specimens as noted below by Dr. Marko Plume.

## 2013-08-01 NOTE — Telephone Encounter (Signed)
Message copied by Baruch Merl on Thu Aug 01, 2013  5:46 PM ------      Message from: Evlyn Clines P      Created: Thu Aug 01, 2013  4:40 PM       Labs seen and need follow up: please let her know no infection in urine and C diff negative ------

## 2013-08-02 ENCOUNTER — Telehealth: Payer: Self-pay | Admitting: Oncology

## 2013-08-02 NOTE — Telephone Encounter (Signed)
per pof to send pt back to lab-pof to sch 6 mths w/LL-LL sch not opened-will print pof to sch when templete opens

## 2013-09-06 ENCOUNTER — Ambulatory Visit: Payer: 59 | Admitting: Gynecology

## 2013-09-27 ENCOUNTER — Ambulatory Visit: Payer: 59

## 2013-09-27 ENCOUNTER — Ambulatory Visit: Payer: 59 | Attending: Gynecologic Oncology | Admitting: Gynecologic Oncology

## 2013-09-27 ENCOUNTER — Encounter: Payer: Self-pay | Admitting: Gynecologic Oncology

## 2013-09-27 VITALS — BP 103/68 | HR 88 | Temp 98.5°F | Resp 18 | Ht 65.0 in | Wt 139.4 lb

## 2013-09-27 DIAGNOSIS — R197 Diarrhea, unspecified: Secondary | ICD-10-CM | POA: Diagnosis not present

## 2013-09-27 DIAGNOSIS — R5381 Other malaise: Secondary | ICD-10-CM | POA: Diagnosis not present

## 2013-09-27 DIAGNOSIS — Z79899 Other long term (current) drug therapy: Secondary | ICD-10-CM | POA: Diagnosis not present

## 2013-09-27 DIAGNOSIS — R109 Unspecified abdominal pain: Secondary | ICD-10-CM

## 2013-09-27 DIAGNOSIS — R5383 Other fatigue: Secondary | ICD-10-CM

## 2013-09-27 DIAGNOSIS — C541 Malignant neoplasm of endometrium: Secondary | ICD-10-CM

## 2013-09-27 DIAGNOSIS — Z90710 Acquired absence of both cervix and uterus: Secondary | ICD-10-CM | POA: Insufficient documentation

## 2013-09-27 DIAGNOSIS — Z87891 Personal history of nicotine dependence: Secondary | ICD-10-CM | POA: Diagnosis not present

## 2013-09-27 DIAGNOSIS — N739 Female pelvic inflammatory disease, unspecified: Secondary | ICD-10-CM

## 2013-09-27 DIAGNOSIS — R1032 Left lower quadrant pain: Secondary | ICD-10-CM

## 2013-09-27 DIAGNOSIS — C549 Malignant neoplasm of corpus uteri, unspecified: Secondary | ICD-10-CM

## 2013-09-27 LAB — URINALYSIS, MICROSCOPIC - CHCC
Bacteria, UA: NEGATIVE
Bilirubin (Urine): NEGATIVE
GLUCOSE UR CHCC: NEGATIVE mg/dL
KETONES: NEGATIVE mg/dL
Leukocyte Esterase: NEGATIVE
Nitrite: NEGATIVE
PH: 6 (ref 4.6–8.0)
Protein: NEGATIVE mg/dL
SPECIFIC GRAVITY, URINE: 1.015 (ref 1.003–1.035)
Urobilinogen, UR: 0.2 mg/dL (ref 0.2–1)
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
## 2013-09-28 LAB — CA 125(PREVIOUS METHOD): CA 125: 4.4 U/mL (ref 0.0–30.2)

## 2013-09-28 LAB — CA 125: CA 125: 5 U/mL (ref <35)

## 2013-10-01 ENCOUNTER — Telehealth: Payer: Self-pay | Admitting: *Deleted

## 2013-10-01 NOTE — Telephone Encounter (Signed)
Message copied by Tashanti Dalporto, Aletha Halim on Tue Oct 01, 2013  8:24 AM ------      Message from: Everitt Amber C      Created: Mon Sep 30, 2013  2:55 PM       Can we please let Ms Guthridge know that her CA 125 was normal and her vaginal biopsy was also normal. We are waiting on her CT result, but at present there is nothing that suggests cancer recurrence.            Donaciano Eva, MD       ------

## 2013-10-01 NOTE — Telephone Encounter (Signed)
Notified pt of CA 125, Biopsy normal. Pt's has requested to make her own appt for GI, gave pt [facility] number. Notified Office pt will be calling for her appt. CT appt made for Oct 2 at 6:15pm- as requested by pt. Pt verbalized above information and advised she will come by to pick up contrast prior to CT.

## 2013-10-02 ENCOUNTER — Ambulatory Visit: Payer: Self-pay | Admitting: Gastroenterology

## 2013-10-02 ENCOUNTER — Encounter: Payer: Self-pay | Admitting: Gastroenterology

## 2013-10-02 ENCOUNTER — Ambulatory Visit (INDEPENDENT_AMBULATORY_CARE_PROVIDER_SITE_OTHER): Payer: 59 | Admitting: Gastroenterology

## 2013-10-02 ENCOUNTER — Other Ambulatory Visit: Payer: Self-pay | Admitting: Family Medicine

## 2013-10-02 VITALS — BP 110/70 | HR 80 | Ht 65.0 in | Wt 138.0 lb

## 2013-10-02 DIAGNOSIS — Z1211 Encounter for screening for malignant neoplasm of colon: Secondary | ICD-10-CM | POA: Insufficient documentation

## 2013-10-02 DIAGNOSIS — C541 Malignant neoplasm of endometrium: Secondary | ICD-10-CM

## 2013-10-02 DIAGNOSIS — C549 Malignant neoplasm of corpus uteri, unspecified: Secondary | ICD-10-CM

## 2013-10-02 DIAGNOSIS — R1032 Left lower quadrant pain: Secondary | ICD-10-CM | POA: Insufficient documentation

## 2013-10-02 MED ORDER — CIPROFLOXACIN HCL 500 MG PO TABS: 500.0000 mg | ORAL_TABLET | Freq: Two times a day (BID) | ORAL | Status: DC

## 2013-10-02 MED ORDER — METRONIDAZOLE 500 MG PO TABS: 500.0000 mg | ORAL_TABLET | Freq: Two times a day (BID) | ORAL | Status: DC
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
## 2013-10-02 NOTE — Assessment & Plan Note (Signed)
Patient will be scheduled for screening colonoscopy once her abdominal issues have resolved

## 2013-10-02 NOTE — Assessment & Plan Note (Signed)
5 day history of mildly progressive left lower quadrant pain, generalized malaise.  Symptoms could be do to a low-grade diverticulitis despite the fact that diverticula were not described on prior CT scan.  Doubt recurrent neoplasm in the face of negative CT in May, 2015. Is no evidence for UTI.    Recommendations #1 begin Cipro and Flagyl #2 repeat CT scan

## 2013-10-02 NOTE — Patient Instructions (Signed)
You have been scheduled for a CT scan of the abdomen and pelvis at [facility] ([address]---this is in the same building as [facility]).   You are scheduled on 10/07/2013 at 830am. You should arrive 15 minutes prior to your appointment time for registration. Please follow the written instructions below on the day of your exam:  WARNING: IF YOU ARE ALLERGIC TO IODINE/X-RAY DYE, PLEASE NOTIFY RADIOLOGY IMMEDIATELY AT [phone]! YOU WILL BE GIVEN A 13 HOUR PREMEDICATION PREP.  1) Do not eat or drink anything after 4:30am (4 hours prior to your test) 2) You have been given 2 bottles of oral contrast to drink. The solution may taste               better if refrigerated, but do NOT add ice or any other liquid to this solution. Shake             well before drinking.    Drink 1 bottle of contrast @ 6:30am (2 hours prior to your exam)  Drink 1 bottle of contrast @ 7:30am (1 hour prior to your exam)  You may take any medications as prescribed with a small amount of water except for the following: Metformin, Glucophage, Glucovance, Avandamet, Riomet, Fortamet, Actoplus Met, Janumet, Glumetza or Metaglip. The above medications must be held the day of the exam AND 48 hours after the exam.  The purpose of you drinking the oral contrast is to aid in the visualization of your intestinal tract. The contrast solution may cause some diarrhea. Before your exam is started, you will be given a small amount of fluid to drink. Depending on your individual set of symptoms, you may also receive an intravenous injection of x-ray contrast/dye. Plan on being at [facility] for 30 minutes or long, depending on the type of exam you are having performed.  If you have any questions regarding your exam or if you need to reschedule, you may call the CT department at [phone] between the hours of 8:00 am and 5:00 pm,  Monday-Friday.  ________________________________________________________________________  Please call our office a few days to let us know how you are doing. 

## 2013-10-07 ENCOUNTER — Ambulatory Visit (INDEPENDENT_AMBULATORY_CARE_PROVIDER_SITE_OTHER)
Admission: RE | Admit: 2013-10-07 | Discharge: 2013-10-07 | Disposition: A | Discharge status: Home / Self Care | Payer: 59 | Source: Ambulatory Visit | Attending: Gastroenterology | Admitting: Gastroenterology

## 2013-10-07 DIAGNOSIS — R1032 Left lower quadrant pain: Secondary | ICD-10-CM

## 2013-10-07 MED ORDER — IOHEXOL 300 MG/ML  SOLN
100.0000 mL | Freq: Once | INTRAMUSCULAR | Status: AC | PRN
Start: 2013-10-07 — End: 2013-10-07
  Administered 2013-10-07: 100 mL via INTRAVENOUS

## 2013-10-07 NOTE — Progress Notes (Signed)
Quick Note:  Please inform the patient that CT was negative. His her pain improving? If so she should continue antibiotics. If not I need to know ______

## 2013-10-18 ENCOUNTER — Ambulatory Visit (HOSPITAL_COMMUNITY): Admission: RE | Admit: 2013-10-18 | Payer: 59 | Source: Ambulatory Visit

## 2013-10-25 ENCOUNTER — Ambulatory Visit: Payer: 59 | Admitting: Gynecology

## 2013-12-27 ENCOUNTER — Ambulatory Visit: Payer: 59 | Attending: Gynecology | Admitting: Gynecology

## 2013-12-27 ENCOUNTER — Encounter: Payer: Self-pay | Admitting: Gynecology

## 2013-12-27 ENCOUNTER — Other Ambulatory Visit (HOSPITAL_COMMUNITY)
Admission: RE | Admit: 2013-12-27 | Discharge: 2013-12-27 | Disposition: A | Discharge status: Home / Self Care | Payer: 59 | Source: Ambulatory Visit | Attending: Gynecology | Admitting: Gynecology

## 2013-12-27 ENCOUNTER — Ambulatory Visit (HOSPITAL_BASED_OUTPATIENT_CLINIC_OR_DEPARTMENT_OTHER): Payer: 59

## 2013-12-27 ENCOUNTER — Other Ambulatory Visit: Payer: Self-pay

## 2013-12-27 VITALS — BP 115/74 | HR 90 | Temp 98.5°F | Resp 18 | Ht 65.0 in | Wt 135.0 lb

## 2013-12-27 DIAGNOSIS — Z01411 Encounter for gynecological examination (general) (routine) with abnormal findings: Secondary | ICD-10-CM | POA: Insufficient documentation

## 2013-12-27 DIAGNOSIS — C541 Malignant neoplasm of endometrium: Secondary | ICD-10-CM

## 2013-12-27 DIAGNOSIS — Z90710 Acquired absence of both cervix and uterus: Secondary | ICD-10-CM | POA: Diagnosis not present

## 2013-12-27 DIAGNOSIS — Z79899 Other long term (current) drug therapy: Secondary | ICD-10-CM | POA: Diagnosis not present

## 2013-12-27 DIAGNOSIS — Z90722 Acquired absence of ovaries, bilateral: Secondary | ICD-10-CM | POA: Insufficient documentation

## 2013-12-27 DIAGNOSIS — C549 Malignant neoplasm of corpus uteri, unspecified: Secondary | ICD-10-CM

## 2013-12-27 DIAGNOSIS — R569 Unspecified convulsions: Secondary | ICD-10-CM | POA: Insufficient documentation

## 2013-12-27 DIAGNOSIS — Z981 Arthrodesis status: Secondary | ICD-10-CM | POA: Diagnosis not present

## 2013-12-27 DIAGNOSIS — Z8542 Personal history of malignant neoplasm of other parts of uterus: Secondary | ICD-10-CM

## 2013-12-27 DIAGNOSIS — A58 Granuloma inguinale: Secondary | ICD-10-CM

## 2013-12-27 DIAGNOSIS — Z87891 Personal history of nicotine dependence: Secondary | ICD-10-CM | POA: Insufficient documentation

## 2013-12-27 LAB — CBC WITH DIFFERENTIAL/PLATELET
BASO%: 0.3 % (ref 0.0–2.0)
BASOS ABS: 0 10*3/uL (ref 0.0–0.1)
EOS%: 0 % (ref 0.0–7.0)
Eosinophils Absolute: 0 10*3/uL (ref 0.0–0.5)
HEMATOCRIT: 39.5 % (ref 34.8–46.6)
HEMOGLOBIN: 12.9 g/dL (ref 11.6–15.9)
LYMPH#: 1.8 10*3/uL (ref 0.9–3.3)
LYMPH%: 35.6 % (ref 14.0–49.7)
MCH: 30.3 pg (ref 25.1–34.0)
MCHC: 32.6 g/dL (ref 31.5–36.0)
MCV: 93 fL (ref 79.5–101.0)
MONO#: 0.3 10*3/uL (ref 0.1–0.9)
MONO%: 6 % (ref 0.0–14.0)
NEUT#: 3 10*3/uL (ref 1.5–6.5)
NEUT%: 58.1 % (ref 38.4–76.8)
PLATELETS: 210 10*3/uL (ref 145–400)
RBC: 4.25 10*6/uL (ref 3.70–5.45)
RDW: 12.3 % (ref 11.2–14.5)
WBC: 5.1 10*3/uL (ref 3.9–10.3)

## 2013-12-27 LAB — COMPREHENSIVE METABOLIC PANEL
ALT: 13 U/L (ref 0–35)
AST: 17 U/L (ref 0–37)
Albumin: 4.2 g/dL (ref 3.5–5.2)
Alkaline Phosphatase: 74 U/L (ref 39–117)
BUN: 12 mg/dL (ref 6–23)
CALCIUM: 9.8 mg/dL (ref 8.4–10.5)
CHLORIDE: 99 meq/L (ref 96–112)
CO2: 28 mEq/L (ref 19–32)
Creatinine, Ser: 0.68 mg/dL (ref 0.50–1.10)
GLUCOSE: 93 mg/dL (ref 70–99)
Potassium: 3.8 mEq/L (ref 3.5–5.3)
SODIUM: 140 meq/L (ref 135–145)
Total Bilirubin: 0.2 mg/dL — ABNORMAL LOW (ref 0.3–1.2)
Total Protein: 7.6 g/dL (ref 6.0–8.3)

## 2013-12-27 NOTE — Patient Instructions (Signed)
Return to see [place] in 3 months. We will contact you if you're Pap smear report.  Take Flagyl 1 tablet 3 times a day for 7 days.
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
## 2013-12-28 ENCOUNTER — Other Ambulatory Visit: Payer: Self-pay | Admitting: Oncology

## 2013-12-30 ENCOUNTER — Ambulatory Visit: Payer: Self-pay | Admitting: Gynecology

## 2013-12-30 ENCOUNTER — Encounter: Payer: Self-pay | Admitting: Nurse Practitioner

## 2013-12-30 ENCOUNTER — Telehealth: Payer: Self-pay | Admitting: Nurse Practitioner

## 2013-12-30 ENCOUNTER — Telehealth: Payer: Self-pay | Admitting: *Deleted

## 2013-12-30 NOTE — Telephone Encounter (Signed)
-----   Message from Gordy Levan, MD sent at 12/28/2013  3:21 PM EST ----- Labs seen and need follow up: please let her know labs 12-11 are good, including CA 125 in good low range. She sees Dr Josephina Shih again in March, so will wait to set up next appointment at this clinic until after she sees him.

## 2013-12-30 NOTE — Telephone Encounter (Signed)
Left message to call.

## 2013-12-30 NOTE — Telephone Encounter (Signed)
Patient returning call. Informed her per Dr. Marko Plume her labs and CA 125 are in good range and she will need to f/u with Dr. Marko Plume after her appt with Dr. Fermin Schwab in March 2016. She verbalizes understanding of this. Pt reports nausea, chills, and fever although she has not taken her temp. Pt states he has been taking flagyl since her appt with Dr. Fermin Schwab since Friday and denies gyn-related symptoms such as bleeding or pelvic pain or pressure. Will notify MD and call back.

## 2013-12-30 NOTE — Telephone Encounter (Signed)
Left message for patient to call back to inform her per MD, can stop flagyl due to stomach upset. Dr. Fermin Schwab does not suspect her symptoms of nausea, feverish, chills are secondary to her exam in our clinic on Friday 12/27/13.

## 2013-12-30 NOTE — Progress Notes (Signed)
Late entry: At f/u apt on 12/27/13 with Dr. Fermin Schwab, patient states she has started a new job and does not want to ask off work for lab appt in [place] per Dr. Marko Plume. Patient is asking if labs can be done at today's f/u visit instead of returning again in [place] for labs. RN discussed with Barbaraann Share, RN to Dr. Marko Plume and she entered lab orders to be drawn today, 12/27/13.

## 2013-12-31 ENCOUNTER — Telehealth: Payer: Self-pay | Admitting: Oncology

## 2013-12-31 LAB — CA 125: CA 125: 5 U/mL (ref <35)

## 2013-12-31 LAB — CA 125(PREVIOUS METHOD): CA 125: 5.4 U/mL (ref 0.0–30.2)

## 2013-12-31 NOTE — Telephone Encounter (Signed)
cld pt &left a message in re to appt time & date w/LL-mailed copy of sch

## 2014-01-01 LAB — CYTOLOGY - PAP

## 2014-01-01 NOTE — Telephone Encounter (Signed)
Attempted to reach patient to make sure she recieved VM below. Unable to reach her - left VM to please call us back if her symptoms have not resolved or if she has any other concerns.

## 2014-01-03 ENCOUNTER — Telehealth: Payer: Self-pay | Admitting: Gynecologic Oncology

## 2014-01-03 NOTE — Telephone Encounter (Signed)
Message left for patient with pap smear results: negative and lab results.  Instructed to call for any questions or concerns.

## 2014-01-13 ENCOUNTER — Encounter: Payer: Self-pay | Admitting: *Deleted

## 2014-01-14 ENCOUNTER — Encounter: Payer: Self-pay | Admitting: Obstetrics & Gynecology

## 2014-02-02 ENCOUNTER — Other Ambulatory Visit: Payer: Self-pay | Admitting: Oncology

## 2014-02-02 DIAGNOSIS — C541 Malignant neoplasm of endometrium: Secondary | ICD-10-CM

## 2014-02-03 ENCOUNTER — Other Ambulatory Visit: Payer: Self-pay

## 2014-02-03 ENCOUNTER — Ambulatory Visit: Payer: Self-pay | Admitting: Oncology

## 2014-02-03 NOTE — Progress Notes (Signed)
Ms. Brandi Warren  Lab and MD appointment on 02-03-14. Labs normal at visit with Dr. Aldean Ast on 12-28-14.  Reschedule appointment with Medical Oncology if follow up needed per Dr. Marko Plume.  Appointment 03-28-14 with Dr. Aldean Ast.

## 2014-03-28 ENCOUNTER — Encounter: Payer: Self-pay | Admitting: Gynecologic Oncology

## 2014-03-28 ENCOUNTER — Ambulatory Visit: Payer: 59 | Admitting: Gynecology

## 2014-03-28 ENCOUNTER — Ambulatory Visit: Payer: Self-pay | Attending: Gynecologic Oncology | Admitting: Gynecologic Oncology

## 2014-03-28 VITALS — BP 124/68 | HR 83 | Temp 98.2°F | Resp 18 | Ht 65.0 in | Wt 136.0 lb

## 2014-03-28 DIAGNOSIS — Z79899 Other long term (current) drug therapy: Secondary | ICD-10-CM | POA: Insufficient documentation

## 2014-03-28 DIAGNOSIS — Z791 Long term (current) use of non-steroidal anti-inflammatories (NSAID): Secondary | ICD-10-CM | POA: Insufficient documentation

## 2014-03-28 DIAGNOSIS — Z881 Allergy status to other antibiotic agents status: Secondary | ICD-10-CM | POA: Insufficient documentation

## 2014-03-28 DIAGNOSIS — C541 Malignant neoplasm of endometrium: Secondary | ICD-10-CM | POA: Insufficient documentation

## 2014-03-28 NOTE — Patient Instructions (Signed)
Plan to follow up in three months or sooner.  Call if any symptoms develop or if the vaginal odor does not go away.  Please call for any questions or concerns.
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
## 2014-04-18 ENCOUNTER — Ambulatory Visit: Payer: Self-pay | Admitting: Gynecology

## 2014-06-02 ENCOUNTER — Ambulatory Visit: Payer: Self-pay | Admitting: Gynecology

## 2014-06-02 ENCOUNTER — Telehealth: Payer: Self-pay | Admitting: *Deleted

## 2014-06-02 NOTE — Telephone Encounter (Signed)
Patient called stating she would like to cancel her appt this morning with Dr. Fermin Schwab as she is feeling better and cannot make the appt.  Patient states she will keep appt for Friday, 06/20/14, at 2:45pm. Reminded patient this appt is with Dr. Denman George. Patient agreeable to come to appt at scheduled time. Told patient that if she has any further concerns prior to this appt to please call our office.

## 2014-06-20 ENCOUNTER — Ambulatory Visit: Payer: Self-pay | Admitting: Gynecologic Oncology

## 2014-06-25 ENCOUNTER — Telehealth: Payer: Self-pay | Admitting: *Deleted

## 2014-06-25 NOTE — Telephone Encounter (Signed)
Received call from patient inquiring about appt availability on Fridays starting the week of 07/04/14. Told patient that we can see her this Friday, 06/27/14, as scheduled or the next available Friday would be 07/25/14. Offered for patient to see MD on other days of the week but patient specifically requesting Friday only appt. Patient states she will keep appt for this Friday, 06/27/14 with a 2:30pm arrival time. Told patient if she changes her mind to please call our office back prior to Friday.

## 2014-06-27 ENCOUNTER — Encounter: Payer: Self-pay | Admitting: Gynecologic Oncology

## 2014-06-27 ENCOUNTER — Ambulatory Visit: Payer: Managed Care, Other (non HMO) | Attending: Gynecologic Oncology | Admitting: Gynecologic Oncology

## 2014-06-27 VITALS — BP 118/68 | HR 72 | Temp 98.0°F | Resp 20 | Ht 65.0 in | Wt 138.0 lb

## 2014-06-27 DIAGNOSIS — C541 Malignant neoplasm of endometrium: Secondary | ICD-10-CM | POA: Diagnosis not present

## 2014-06-27 DIAGNOSIS — R1032 Left lower quadrant pain: Secondary | ICD-10-CM

## 2014-06-27 DIAGNOSIS — Z90722 Acquired absence of ovaries, bilateral: Secondary | ICD-10-CM | POA: Insufficient documentation

## 2014-06-27 DIAGNOSIS — Z90710 Acquired absence of both cervix and uterus: Secondary | ICD-10-CM | POA: Insufficient documentation

## 2014-06-27 DIAGNOSIS — Z8542 Personal history of malignant neoplasm of other parts of uterus: Secondary | ICD-10-CM | POA: Diagnosis not present

## 2014-06-27 DIAGNOSIS — Z9221 Personal history of antineoplastic chemotherapy: Secondary | ICD-10-CM | POA: Insufficient documentation

## 2014-06-27 DIAGNOSIS — Z9049 Acquired absence of other specified parts of digestive tract: Secondary | ICD-10-CM | POA: Diagnosis not present

## 2014-06-27 DIAGNOSIS — Z87891 Personal history of nicotine dependence: Secondary | ICD-10-CM | POA: Diagnosis not present
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
## 2014-06-27 NOTE — Patient Instructions (Addendum)
Followup with Dr. Denman George in 3 months as scheduled. If your stomach pain persists please call our office so we can arrange for a CT scan.

## 2014-07-14 ENCOUNTER — Other Ambulatory Visit: Payer: Self-pay | Admitting: *Deleted

## 2014-07-14 ENCOUNTER — Telehealth: Payer: Self-pay | Admitting: *Deleted

## 2014-07-14 DIAGNOSIS — C541 Malignant neoplasm of endometrium: Secondary | ICD-10-CM

## 2014-07-14 NOTE — Telephone Encounter (Addendum)
Pt called states " I'm still having some pain on my left side, getting worse and I think I would like to go ahead and get the CT Scan." Progress note 6/10 MD recommended CT scan. Order entered, CT CAP scheduled Friday 7/1 at 52  Pt notified, advised she will need to check schedule at work, requested new date for CT and labs  Called pt lmovm with new CT appt date for Friday 7/8, at 8am, 830am, 11am,1130am-pt to call back to confirm what time will work best for her. Pt will need labs prior.

## 2014-07-16 ENCOUNTER — Other Ambulatory Visit: Payer: Self-pay | Admitting: *Deleted

## 2014-07-16 ENCOUNTER — Other Ambulatory Visit: Payer: Self-pay | Admitting: Gynecologic Oncology

## 2014-07-16 DIAGNOSIS — R1032 Left lower quadrant pain: Secondary | ICD-10-CM

## 2014-07-16 MED ORDER — HYDROCODONE-ACETAMINOPHEN 5-325 MG PO TABS: 1.0000 | ORAL_TABLET | Freq: Four times a day (QID) | ORAL | Status: DC | PRN

## 2014-07-16 NOTE — Progress Notes (Signed)
Patient called and spoke with Cyril Mourning, RN stating that her abdominal pain has increased since she spoke with Stanton Kidney, RN this past Monday. Patient states that she called radiology scheduling and is unable to get CT scan sooner than Friday, 07/18/14. Patient requesting pain medication and something stronger than Tramadol ("That Tramadol has never worked for me."). Prescription written for Vicodin and patient is agreeable to pickup script at [facility] later today when she comes to get oral CT contrast. Advised patient if the pain continues to worsen to please report to the nearest emergency department - patient agreeable to this and no other concerns noted at this time.

## 2014-07-17 ENCOUNTER — Other Ambulatory Visit: Payer: Self-pay | Admitting: *Deleted

## 2014-07-17 DIAGNOSIS — C541 Malignant neoplasm of endometrium: Secondary | ICD-10-CM

## 2014-07-18 ENCOUNTER — Ambulatory Visit (HOSPITAL_COMMUNITY)
Admission: RE | Admit: 2014-07-18 | Discharge: 2014-07-18 | Disposition: A | Discharge status: Home / Self Care | Payer: Managed Care, Other (non HMO) | Source: Ambulatory Visit | Attending: Gynecologic Oncology | Admitting: Gynecologic Oncology

## 2014-07-18 ENCOUNTER — Telehealth: Payer: Self-pay | Admitting: Gynecologic Oncology

## 2014-07-18 ENCOUNTER — Ambulatory Visit (HOSPITAL_COMMUNITY): Payer: Managed Care, Other (non HMO)

## 2014-07-18 ENCOUNTER — Encounter (HOSPITAL_COMMUNITY): Payer: Self-pay

## 2014-07-18 ENCOUNTER — Telehealth: Payer: Self-pay | Admitting: Gastroenterology

## 2014-07-18 DIAGNOSIS — D134 Benign neoplasm of liver: Secondary | ICD-10-CM | POA: Diagnosis not present

## 2014-07-18 DIAGNOSIS — Z9221 Personal history of antineoplastic chemotherapy: Secondary | ICD-10-CM | POA: Insufficient documentation

## 2014-07-18 DIAGNOSIS — R1032 Left lower quadrant pain: Secondary | ICD-10-CM | POA: Diagnosis not present

## 2014-07-18 DIAGNOSIS — Z8542 Personal history of malignant neoplasm of other parts of uterus: Secondary | ICD-10-CM | POA: Diagnosis not present

## 2014-07-18 DIAGNOSIS — R509 Fever, unspecified: Secondary | ICD-10-CM | POA: Diagnosis present

## 2014-07-18 DIAGNOSIS — C541 Malignant neoplasm of endometrium: Secondary | ICD-10-CM

## 2014-07-18 MED ORDER — IOHEXOL 300 MG/ML  SOLN
100.0000 mL | Freq: Once | INTRAMUSCULAR | Status: AC | PRN
  Administered 2014-07-18: 100 mL via INTRAVENOUS

## 2014-07-18 NOTE — Telephone Encounter (Signed)
Patient informed of CT scan results.  Patient wanting our office to call and get her an appt to follow up with Dr. Deatra Ina with GI.  Advised she would be contacted with an appt date and time.  No other concerns voiced.  Advised to call back to the office for any questions or concerns.

## 2014-07-18 NOTE — Telephone Encounter (Signed)
The patient has been in touch with her other doctors. She has a low abdominal cramping sensation. She admits to constipation. She has taken MOM for this and had some results. Other doctor ordered an CT of the abdomen which was negative for diverticulitis. An appointment is scheduled for 07/22/14. She understands to go to the ER if she acutely worsens or fails to improve.

## 2014-07-18 NOTE — Telephone Encounter (Signed)
-----   Message from Everitt Amber, MD sent at 07/18/2014  9:54 AM EDT ----- Can we let Tyliah know that her CT did not show cancer (or any other major issues). Does not explain her pain which can be followed up with her PCP. Thanks Terrence Dupont

## 2014-07-18 NOTE — Telephone Encounter (Signed)
Returned call to patient.  Patient left message asking about receiving an antibiotic due to her abdominal cramping.  CT scan negative.  On previous call, she was advised to follow up with PCP per Dr. Denman George.  Advised on the message to follow up with her PCP and that an antibiotic would not be warranted just for abdominal cramping without further evaluation.  Advised to call for any questions or concerns.  Appt made at [facility] with NP on July 18 at 09:30pm.  Next available for Dr. Deatra Ina is in Sept.

## 2014-07-22 ENCOUNTER — Ambulatory Visit: Payer: Self-pay | Admitting: Nurse Practitioner

## 2014-08-04 ENCOUNTER — Ambulatory Visit: Payer: Self-pay | Admitting: Nurse Practitioner

## 2014-08-26 ENCOUNTER — Encounter: Payer: Self-pay | Admitting: *Deleted

## 2014-08-27 ENCOUNTER — Telehealth: Payer: Self-pay | Admitting: Nurse Practitioner

## 2014-08-27 NOTE — Telephone Encounter (Signed)
Patient calling to report ongoing left sided ABD pain that continues to be "sore and tender on the inside." Patient had labs drawn at [employer] where she works, Rn requested a copy for review. Patient reports having a fever "but not lately" and does not report a specific number value. Pt reports she will see GI on Friday but isn't sure she should keep the apt because she believes this to be a gyn issue. Her bowels "are not right," reporting a small BM on 08/26/14. Rn encouraged pt to try miralax for bowel movements and to keep GI apt. Also recommended f/u with PCP since, per Joylene John, NP, we have evaluated her from a gyn onc standpoint.   Patient called back to see if we received labs. Informed her WBC is WNL and NP will review further. We will call back if any new recommendations but to keep GI apt and f/u with PCP also. Patient verbalizes understanding of the above.

## 2014-08-29 ENCOUNTER — Ambulatory Visit (INDEPENDENT_AMBULATORY_CARE_PROVIDER_SITE_OTHER): Payer: Managed Care, Other (non HMO) | Admitting: Gastroenterology

## 2014-08-29 ENCOUNTER — Encounter: Payer: Self-pay | Admitting: Gastroenterology

## 2014-08-29 VITALS — BP 106/74 | HR 91 | Ht 60.75 in | Wt 139.8 lb

## 2014-08-29 DIAGNOSIS — R1032 Left lower quadrant pain: Secondary | ICD-10-CM | POA: Insufficient documentation

## 2014-08-29 DIAGNOSIS — Z1211 Encounter for screening for malignant neoplasm of colon: Secondary | ICD-10-CM

## 2014-08-29 DIAGNOSIS — R194 Change in bowel habit: Secondary | ICD-10-CM | POA: Insufficient documentation

## 2014-08-29 MED ORDER — NA SULFATE-K SULFATE-MG SULF 17.5-3.13-1.6 GM/177ML PO SOLN: ORAL | Status: DC

## 2014-08-29 NOTE — Progress Notes (Signed)
Reviewed and agree with management. Averlee Swartz D. Carriann Hesse, M.D., FACG  

## 2014-08-29 NOTE — Progress Notes (Signed)
     08/29/2014 Brandi Warren [id number] 21-Dec-1960   History of Present Illness:  This is a 54 year old female who had previously been seen by Dr. Deatra Ina in September 2015 with complaints of left lower quadrant abdominal pain. She was treated empirically with Cipro and Flagyl for diverticulitis, however, CT scan did not show any evidence of diverticulitis or diverticulosis. She's never undergone colonoscopy in the past, therefore, it was recommended she have colonoscopy once this acute issue has resolved. She presents to our office again today with complaints of left lower quadrant abdominal pain. She tells me that this pain has been present on and off since her surgery/hysterectomy for endometrial cancer. It did seem to resolve after she saw Dr. Deatra Ina previously but has now been back again for several months. Recent CBC, TSH, ferritin studies, high-sensitivity CRP, and CMP were all within normal limits. She also says that she is not having bowel movements as regularly she used to. Says she used to drink a cup of coffee and it would allow her to have a good bowel movement. She denies seeing blood in her stool.  She's had 3 CT scans of the abdomen and pelvis since May 2015, without any findings, particularly to account for her left lower quadrant abdominal pain. CT scan from September 2015 did show a small left-sided groin hernia containing only fat.   Current Medications, Allergies, Past Medical History, Past Surgical History, Family History and Social History were reviewed in [employer] record.   Physical Exam: BP 106/74 mmHg  Pulse 91  Ht 5' 0.75" (1.543 m)  Wt 139 lb 12.8 oz (63.413 kg)  BMI 26.63 kg/m2  LMP 01/23/2013 General: Well developed white female in no acute distress Head: Normocephalic and atraumatic Eyes:  Sclerae anicteric, conjunctiva pink  Ears: Normal auditory acuity Lungs: Clear throughout to auscultation Heart: Regular rate and  rhythm Abdomen: Soft, non-distended.  Normal bowel sounds.  Mild LLQ TTP without R/R/G. Rectal:  Will be done at the time of colonoscopy. Musculoskeletal: Symmetrical with no gross deformities  Extremities: No edema  Neurological: Alert oriented x 4, grossly non-focal Psychological:  Alert and cooperative. Normal mood and affect  Assessment and Recommendations: -Screening colonoscopy:  Will schedule with Dr. Deatra Ina.  The risks, benefits, and alternatives to colonoscopy were discussed with the patient and he consents to proceed.  -LLQ abdominal pain:  Previously treated for diverticulitis, however, 3 CT scans do not show any diverticulosis or other acute issues.  ? Scar tissue/adhesive disease post hysterectomy vs inguinal hernia (noted on one of the CT scans).  Will assess with colonoscopy as well, however, could consider anti-spasmodic or surgical referral if no source found on colonoscopy.   -Change in bowel habits:  Does not move her bowels as well as she had in the past.  Likely a motility issue.  Consider Miralax daily.

## 2014-08-29 NOTE — Patient Instructions (Signed)
You have been scheduled for a colonoscopy. Please follow written instructions given to you at your visit today.  Please pick up your prep supplies at the pharmacy within the next 1-3 days. If you use inhalers (even only as needed), please bring them with you on the day of your procedure.   

## 2014-09-03 ENCOUNTER — Other Ambulatory Visit: Payer: Self-pay | Admitting: *Deleted

## 2014-09-03 ENCOUNTER — Telehealth: Payer: Self-pay | Admitting: Gastroenterology

## 2014-09-03 MED ORDER — DIPHENOXYLATE-ATROPINE 2.5-0.025 MG PO TABS: 1.0000 | ORAL_TABLET | Freq: Four times a day (QID) | ORAL | Status: DC

## 2014-09-03 NOTE — Telephone Encounter (Signed)
Printed and faxed prescription of lomotil left message for patient

## 2014-09-03 NOTE — Telephone Encounter (Signed)
Brandi Warren you seen this patient on the 12th. Patient states she is having severe diarrhea and a fever. Pepto not working and Ibuprofen   Patient has had diarrhea since Monday afternoon.  Wants something sent in for diarrhea to [facility] on [address]. In [place]

## 2014-09-03 NOTE — Telephone Encounter (Signed)
Ok to call in Lomotil, one tablet every 6 hours prn.  #30 no refills.  Call back if it persists for a prolonged period of time and may need stool studies, but probably do no need to work-up 2 days of diarrhea at this point.  Thank you,  Jess

## 2014-09-10 ENCOUNTER — Other Ambulatory Visit: Payer: Self-pay

## 2014-09-10 ENCOUNTER — Telehealth: Payer: Self-pay | Admitting: Gastroenterology

## 2014-09-10 DIAGNOSIS — R197 Diarrhea, unspecified: Secondary | ICD-10-CM

## 2014-09-10 NOTE — Telephone Encounter (Signed)
She has not taken anything for diarrhea in 3 days. She hasn't felt well for the past 3 days either. Today the diarrhea return. 3 bowel diarrhea stools today. She has taken Lomotil again today as well as Pepto-Bismol. Please advise. Stool studies? She saw Janett Billow on 08/29/14. She has follow up with you on 9/21 and a screening colon on 10/13.

## 2014-09-10 NOTE — Telephone Encounter (Signed)
Left her a detailed message with instructions to pick up her specimen containers. To collect a diarrhea stool, then to take the Lomotil as needed. Call with questions.

## 2014-09-10 NOTE — Telephone Encounter (Signed)
Spoke with the patient. She is working from home. A call came in and she had to go before any information was gotten from her.

## 2014-09-10 NOTE — Telephone Encounter (Signed)
Please send a stool for pathogen panel.  She should take Lomotil as needed

## 2014-10-08 ENCOUNTER — Ambulatory Visit: Payer: Self-pay | Admitting: Gastroenterology

## 2014-10-09 ENCOUNTER — Telehealth: Payer: Self-pay | Admitting: *Deleted

## 2014-10-09 NOTE — Telephone Encounter (Signed)
Received call from patient stating she is not able to come tomorrow for the appointment and would like to reschedule for October. Appt made for 11/07/14 at 9:15am - patient agreeable to new appt. No other questions or concerns noted at this time.

## 2014-10-10 ENCOUNTER — Ambulatory Visit: Payer: Self-pay | Admitting: Gynecologic Oncology

## 2014-10-30 ENCOUNTER — Encounter: Payer: Self-pay | Admitting: Gastroenterology

## 2014-11-03 ENCOUNTER — Ambulatory Visit (AMBULATORY_SURGERY_CENTER): Payer: Managed Care, Other (non HMO) | Admitting: Gastroenterology

## 2014-11-03 ENCOUNTER — Encounter: Payer: Self-pay | Admitting: Gastroenterology

## 2014-11-03 VITALS — BP 110/68 | HR 69 | Temp 98.5°F | Resp 16 | Ht 63.25 in | Wt 139.0 lb

## 2014-11-03 DIAGNOSIS — Z1211 Encounter for screening for malignant neoplasm of colon: Secondary | ICD-10-CM

## 2014-11-03 DIAGNOSIS — K621 Rectal polyp: Secondary | ICD-10-CM

## 2014-11-03 DIAGNOSIS — D129 Benign neoplasm of anus and anal canal: Secondary | ICD-10-CM

## 2014-11-03 DIAGNOSIS — D128 Benign neoplasm of rectum: Secondary | ICD-10-CM

## 2014-11-03 HISTORY — DX: Rectal polyp: K62.1

## 2014-11-03 MED ORDER — SODIUM CHLORIDE 0.9 % IV SOLN: 500.0000 mL | INTRAVENOUS | Status: DC

## 2014-11-03 NOTE — Patient Instructions (Signed)
YOU HAD AN ENDOSCOPIC PROCEDURE TODAY AT THE [facility]:   Refer to the procedure report that was given to you for any specific questions about what was found during the examination.  If the procedure report does not answer your questions, please call your gastroenterologist to clarify.  If you requested that your care partner not be given the details of your procedure findings, then the procedure report has been included in a sealed envelope for you to review at your convenience later.  YOU SHOULD EXPECT: Some feelings of bloating in the abdomen. Passage of more gas than usual.  Walking can help get rid of the air that was put into your GI tract during the procedure and reduce the bloating. If you had a lower endoscopy (such as a colonoscopy or flexible sigmoidoscopy) you may notice spotting of blood in your stool or on the toilet paper. If you underwent a bowel prep for your procedure, you may not have a normal bowel movement for a few days.  Please Note:  You might notice some irritation and congestion in your nose or some drainage.  This is from the oxygen used during your procedure.  There is no need for concern and it should clear up in a day or so.  SYMPTOMS TO REPORT IMMEDIATELY:   Following lower endoscopy (colonoscopy or flexible sigmoidoscopy):  Excessive amounts of blood in the stool  Significant tenderness or worsening of abdominal pains  Swelling of the abdomen that is new, acute  Fever of 100F or higher   For urgent or emergent issues, a gastroenterologist can be reached at any hour by calling [phone].   DIET: Your first meal following the procedure should be a small meal and then it is ok to progress to your normal diet. Heavy or fried foods are harder to digest and may make you feel nauseous or bloated.  Likewise, meals heavy in dairy and vegetables can increase bloating.  Drink plenty of fluids but you should avoid alcoholic beverages for 24  hours.  ACTIVITY:  You should plan to take it easy for the rest of today and you should NOT DRIVE or use heavy machinery until tomorrow (because of the sedation medicines used during the test).    FOLLOW UP: Our staff will call the number listed on your records the next business day following your procedure to check on you and address any questions or concerns that you may have regarding the information given to you following your procedure. If we do not reach you, we will leave a message.  However, if you are feeling well and you are not experiencing any problems, there is no need to return our call.  We will assume that you have returned to your regular daily activities without incident.  If any biopsies were taken you will be contacted by phone or by letter within the next 1-3 weeks.  Please call us at [phone] if you have not heard about the biopsies in 3 weeks.    SIGNATURES/CONFIDENTIALITY: You and/or your care partner have signed paperwork which will be entered into your electronic medical record.  These signatures attest to the fact that that the information above on your After Visit Summary has been reviewed and is understood.  Full responsibility of the confidentiality of this discharge information lies with you and/or your care-partner. 

## 2014-11-03 NOTE — Progress Notes (Signed)
Called to room to assist during endoscopic procedure.  Patient ID and intended procedure confirmed with present staff. Received instructions for my participation in the procedure from the performing physician.  

## 2014-11-03 NOTE — Progress Notes (Signed)
Transferred to recovery room. A/O x3, pleased with MAC.  VSS.  Report to Karen, RN. 

## 2014-11-03 NOTE — Op Note (Signed)
[facility]  [address] [place], [address]   COLONOSCOPY PROCEDURE REPORT  PATIENT: Brandi Warren, Brandi Warren  MR#: [id number] BIRTHDATE: 1960-05-02 , 54  yrs. old GENDER: female ENDOSCOPIST: Ladene Artist, MD, Marval Regal REFERRED BY:  Timmothy Sours, MD PROCEDURE DATE:  11/03/2014 PROCEDURE:   Colonoscopy, screening and Colonoscopy with snare polypectomy First Screening Colonoscopy - Avg.  risk and is 50 yrs.  old or older Yes.  Prior Negative Screening - Now for repeat screening. N/A  History of Adenoma - Now for follow-up colonoscopy & has been > or = to 3 yrs.  N/A  Polyps removed today? Yes ASA CLASS:   Class II INDICATIONS:Screening for colonic neoplasia and Colorectal Neoplasm Risk Assessment for this procedure is average risk. MEDICATIONS: Monitored anesthesia care and Propofol 175 mg IV DESCRIPTION OF PROCEDURE:   After the risks benefits and alternatives of the procedure were thoroughly explained, informed consent was obtained.  The digital rectal exam revealed no abnormalities of the rectum.   The     endoscope was introduced through the anus and advanced to the cecum, which was identified by both the appendix and ileocecal valve. No adverse events experienced.   The quality of the prep was excellent.  (Suprep was used)  The instrument was then slowly withdrawn as the colon was fully examined. Estimated blood loss is zero unless otherwise noted in this procedure report.    COLON FINDINGS: A sessile polyp measuring 6 mm in size was found in the rectum.  A polypectomy was performed with a cold snare.  The resection was complete, the polyp tissue was completely retrieved and sent to histology.   The colonic mucosa appeared normal at the splenic flexure, in the transverse colon, sigmoid colon, descending colon, at the ileocecal valve, cecum, hepatic flexure, and in the ascending colon.  Retroflexed views revealed no abnormalities. The time to cecum = 1.4  Withdrawal time = 9.9   The scope was withdrawn and the procedure completed. COMPLICATIONS: There were no immediate complications.  ENDOSCOPIC IMPRESSION: 1.   Sessile polyp in the rectum; polypectomy performed with a cold snare 2.   The colonic mucosa otherwise appeared normal  RECOMMENDATIONS: 1.  Await pathology results 2.  Repeat colonoscopy in 5 years if polyp adenomatous; otherwise 10 years  eSigned:  Ladene Artist, MD, [facility] 11/03/2014 1:50 PM

## 2014-11-04 ENCOUNTER — Telehealth: Payer: Self-pay | Admitting: *Deleted

## 2014-11-04 NOTE — Telephone Encounter (Signed)
No answer, left message to call if questions or concerns. 

## 2014-11-07 ENCOUNTER — Ambulatory Visit: Payer: Self-pay | Admitting: Gynecologic Oncology

## 2014-11-12 ENCOUNTER — Encounter: Payer: Self-pay | Admitting: Gastroenterology

## 2014-11-19 ENCOUNTER — Telehealth: Payer: Self-pay | Admitting: Gastroenterology

## 2014-11-19 NOTE — Telephone Encounter (Signed)
Patient reports that she has some rectal irritation since the colonoscopy.  She is advised to try sitz baths and use OTC creams.  Soak in tub of warm water/sitz bath 10 minutes BID, Tucs pads, meticulous cleaning between bowel movements. She will call back for any additional questions or concerns

## 2014-11-19 NOTE — Telephone Encounter (Signed)
And Pt called back and her hemorrhoids are bothering her

## 2014-11-21 ENCOUNTER — Ambulatory Visit: Payer: Managed Care, Other (non HMO) | Attending: Gynecologic Oncology | Admitting: Gynecologic Oncology

## 2014-11-21 ENCOUNTER — Encounter: Payer: Self-pay | Admitting: Gynecologic Oncology

## 2014-11-21 ENCOUNTER — Telehealth: Payer: Self-pay | Admitting: Gastroenterology

## 2014-11-21 VITALS — BP 112/70 | HR 78 | Temp 98.4°F | Resp 18 | Ht 65.0 in | Wt 137.0 lb

## 2014-11-21 DIAGNOSIS — C541 Malignant neoplasm of endometrium: Secondary | ICD-10-CM | POA: Insufficient documentation

## 2014-11-21 DIAGNOSIS — Z8542 Personal history of malignant neoplasm of other parts of uterus: Secondary | ICD-10-CM | POA: Diagnosis not present
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
## 2014-11-21 NOTE — Patient Instructions (Signed)
Follow up with Dr Everitt Amber  in February 2017 , call with any changes , questions or concerns.   Thank you

## 2014-11-21 NOTE — Progress Notes (Signed)
Spoke with Maudie Mercury at [facility] pathology and added MSI testing on uterine specimen from 02/2013, [id number].

## 2014-11-21 NOTE — Telephone Encounter (Signed)
CT scan from 09/2013 forwarded to Dr. Denman George.  I also mailed her a copy at her request

## 2014-11-24 ENCOUNTER — Telehealth: Payer: Self-pay

## 2014-11-24 NOTE — Telephone Encounter (Signed)
Patient contacted and updated with recommendations for hernia repair , patient can contact PCP or call Dr Serita Grit office , call back information given

## 2014-11-28 ENCOUNTER — Encounter: Payer: Self-pay | Admitting: Gynecologic Oncology

## 2014-11-28 NOTE — Progress Notes (Signed)
MSI testing on specimen from 02/2013 was normal.  Dr. Denman George reviewed.

## 2015-02-23 ENCOUNTER — Ambulatory Visit: Payer: Self-pay | Admitting: Gynecologic Oncology

## 2015-04-10 ENCOUNTER — Ambulatory Visit (HOSPITAL_BASED_OUTPATIENT_CLINIC_OR_DEPARTMENT_OTHER): Payer: Managed Care, Other (non HMO)

## 2015-04-10 ENCOUNTER — Encounter: Payer: Self-pay | Admitting: Gynecology

## 2015-04-10 ENCOUNTER — Ambulatory Visit: Payer: 59 | Attending: Gynecology | Admitting: Gynecology

## 2015-04-10 VITALS — BP 120/70 | HR 86 | Temp 98.1°F | Resp 18 | Wt 125.7 lb

## 2015-04-10 DIAGNOSIS — Z8249 Family history of ischemic heart disease and other diseases of the circulatory system: Secondary | ICD-10-CM | POA: Insufficient documentation

## 2015-04-10 DIAGNOSIS — Z90722 Acquired absence of ovaries, bilateral: Secondary | ICD-10-CM | POA: Insufficient documentation

## 2015-04-10 DIAGNOSIS — Z880 Allergy status to penicillin: Secondary | ICD-10-CM | POA: Insufficient documentation

## 2015-04-10 DIAGNOSIS — Z809 Family history of malignant neoplasm, unspecified: Secondary | ICD-10-CM | POA: Insufficient documentation

## 2015-04-10 DIAGNOSIS — R8290 Unspecified abnormal findings in urine: Secondary | ICD-10-CM

## 2015-04-10 DIAGNOSIS — C541 Malignant neoplasm of endometrium: Secondary | ICD-10-CM | POA: Insufficient documentation

## 2015-04-10 DIAGNOSIS — Z8542 Personal history of malignant neoplasm of other parts of uterus: Secondary | ICD-10-CM | POA: Diagnosis not present

## 2015-04-10 DIAGNOSIS — R350 Frequency of micturition: Secondary | ICD-10-CM

## 2015-04-10 DIAGNOSIS — Z9079 Acquired absence of other genital organ(s): Secondary | ICD-10-CM | POA: Insufficient documentation

## 2015-04-10 DIAGNOSIS — Z87891 Personal history of nicotine dependence: Secondary | ICD-10-CM | POA: Insufficient documentation

## 2015-04-10 DIAGNOSIS — Z833 Family history of diabetes mellitus: Secondary | ICD-10-CM | POA: Diagnosis not present

## 2015-04-10 DIAGNOSIS — R569 Unspecified convulsions: Secondary | ICD-10-CM | POA: Diagnosis not present

## 2015-04-10 DIAGNOSIS — Z9221 Personal history of antineoplastic chemotherapy: Secondary | ICD-10-CM | POA: Insufficient documentation

## 2015-04-10 DIAGNOSIS — Z90710 Acquired absence of both cervix and uterus: Secondary | ICD-10-CM | POA: Diagnosis not present

## 2015-04-10 LAB — URINALYSIS, MICROSCOPIC - CHCC
Bilirubin (Urine): NEGATIVE
GLUCOSE UR CHCC: NEGATIVE mg/dL
KETONES: NEGATIVE mg/dL
LEUKOCYTE ESTERASE: NEGATIVE
Nitrite: NEGATIVE
PROTEIN: NEGATIVE mg/dL
SPECIFIC GRAVITY, URINE: 1.015 (ref 1.003–1.035)
Urobilinogen, UR: 0.2 mg/dL (ref 0.2–1)
pH: 6.5 (ref 4.6–8.0)
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
## 2015-04-10 NOTE — Patient Instructions (Signed)
We will contact you with the results of your urine culture from today.  Plan to follow up in six months or sooner if needed.  Please call [phone] in June or July to schedule your appt for Sept 2017.

## 2015-04-11 LAB — URINE CULTURE: Organism ID, Bacteria: NO GROWTH

## 2015-04-15 ENCOUNTER — Telehealth: Payer: Self-pay

## 2015-04-15 NOTE — Telephone Encounter (Signed)
Patient contacted with U/A results from 04/10/2015 during visit with Dr Fermin Schwab. No answer , left a detailed message with call back information provided if additional questions arise.

## 2015-05-06 IMAGING — CR DG CHEST 2V
2 series · 2 of 2 positions shown · non-contrast
Comparison: 01/31/2011

CLINICAL DATA: Endometrial carcinoma.  Preop.  Hypertension.

EXAM:
CHEST  2 VIEW

[w chest pa]
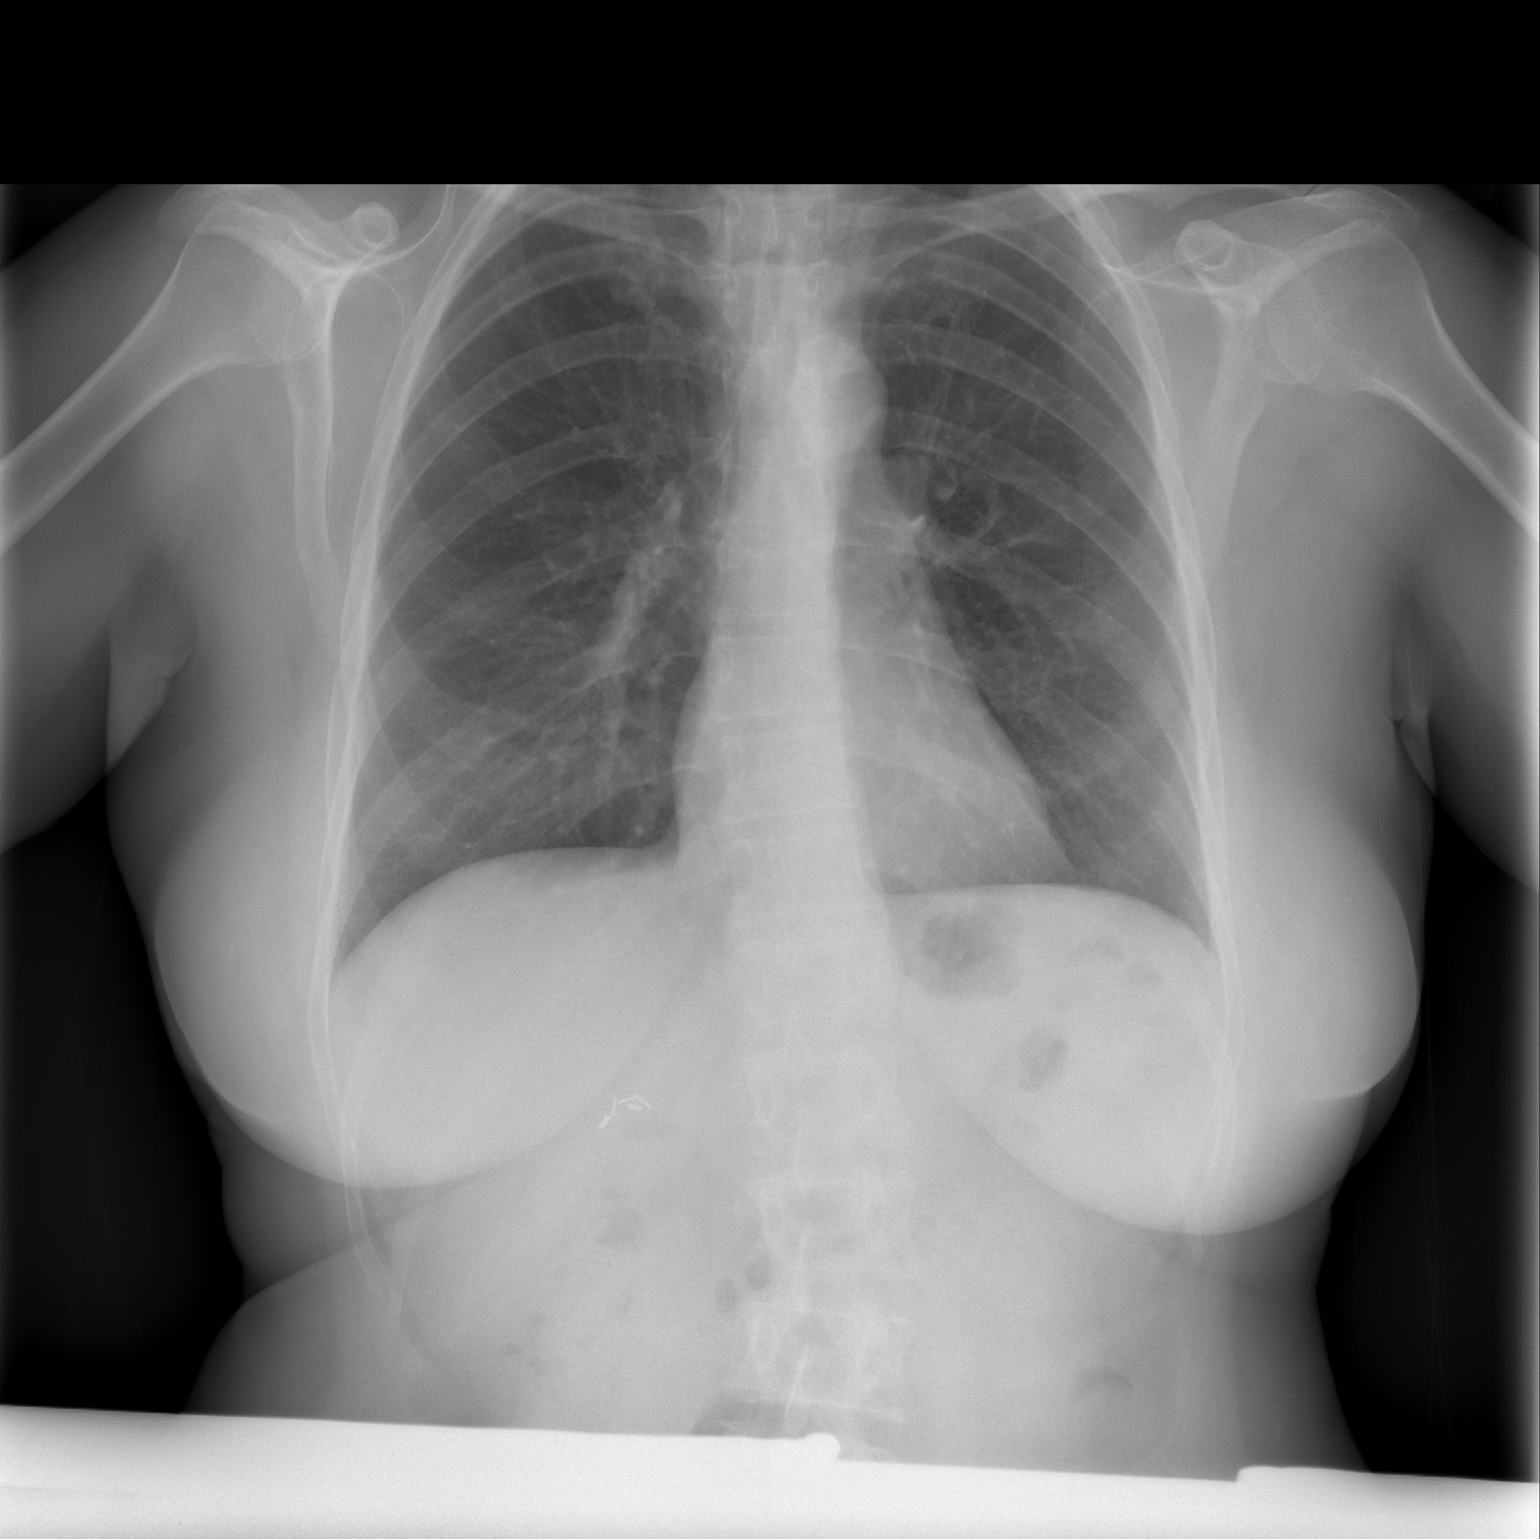

[w chest lat]
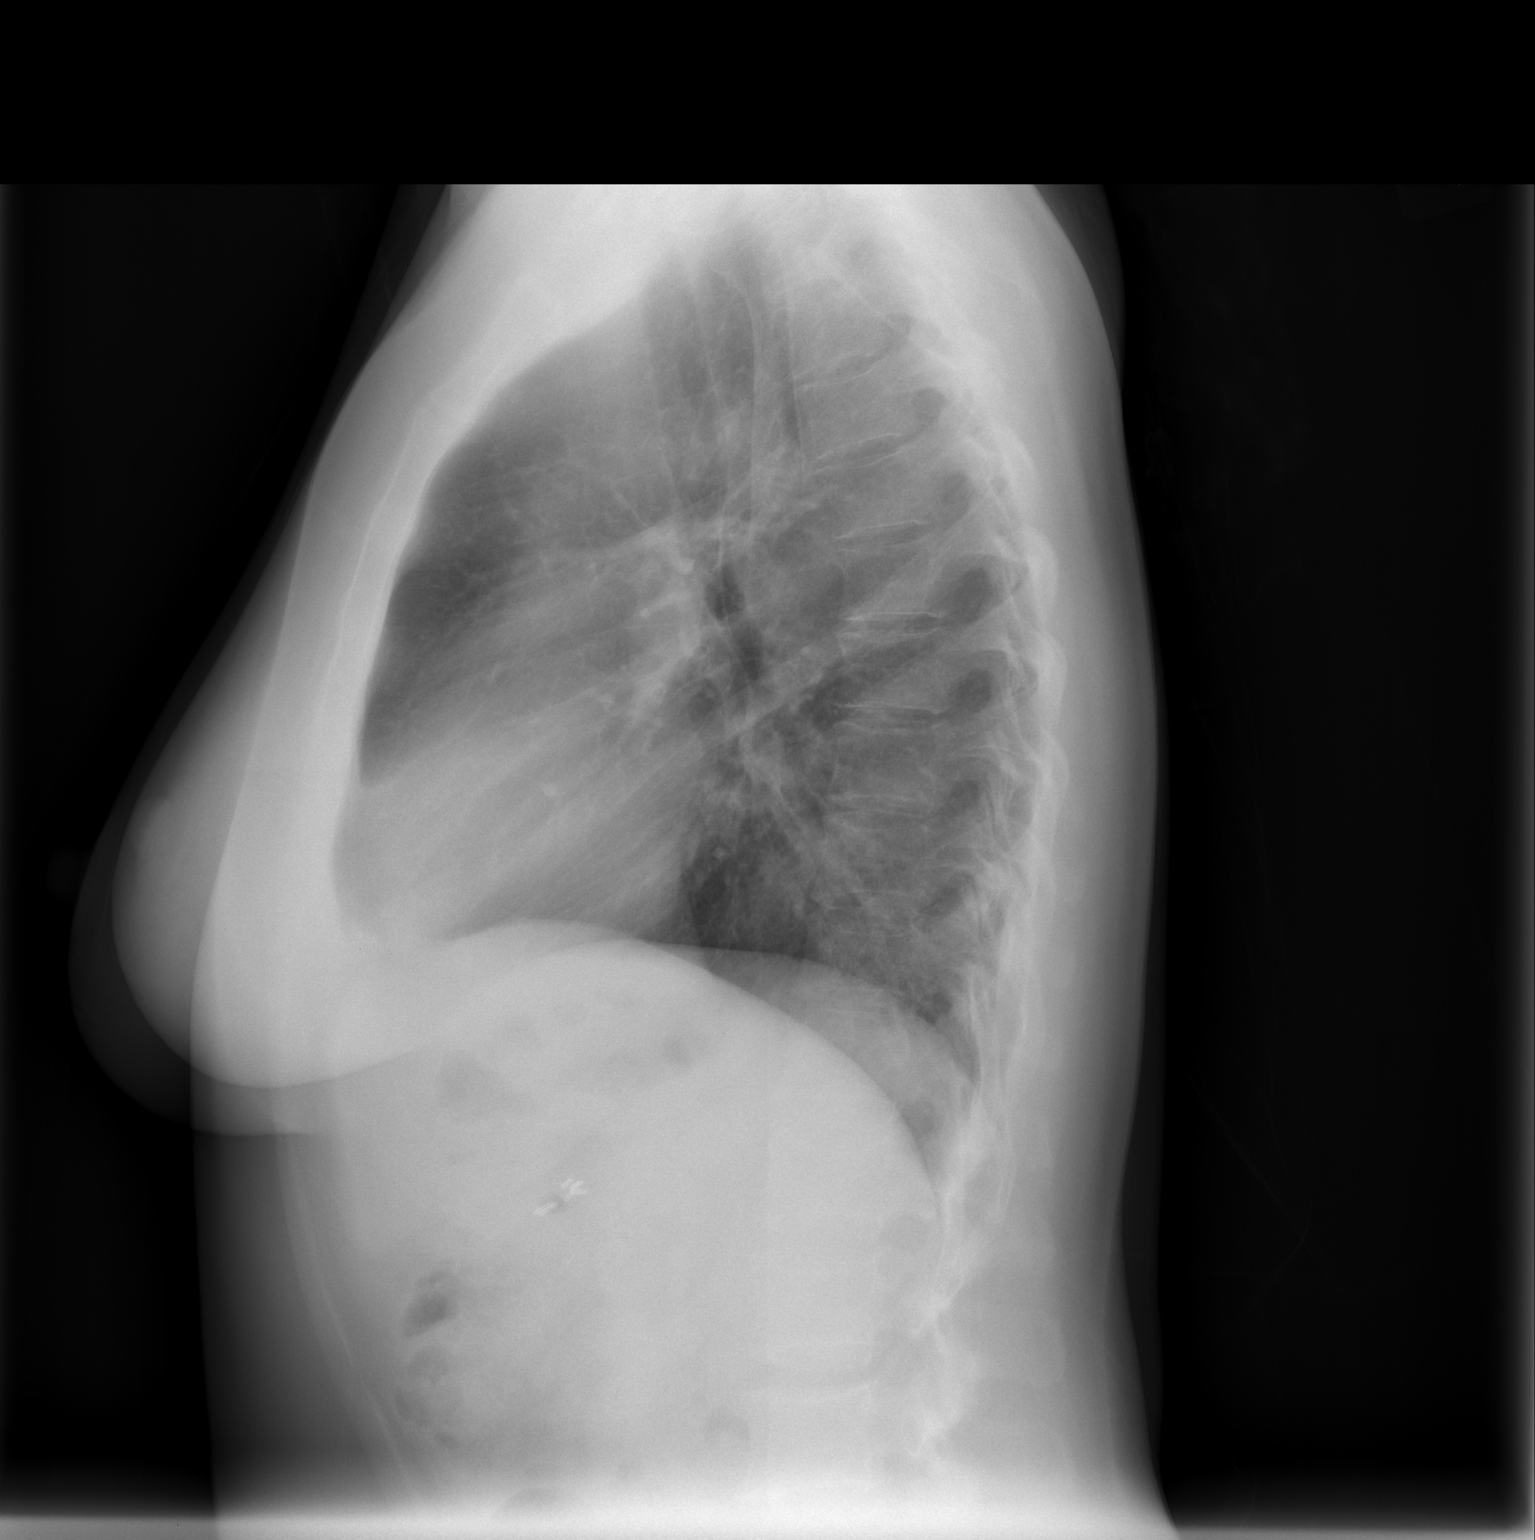

[2 of 2 positions shown; findings below may reference images not displayed]

FINDINGS: The heart size and mediastinal contours are within normal limits.
Both lungs are clear. No lung nodules. The visualized skeletal
structures are unremarkable.
IMPRESSION: No active cardiopulmonary disease.

## 2015-09-25 ENCOUNTER — Other Ambulatory Visit (HOSPITAL_COMMUNITY)
Admission: RE | Admit: 2015-09-25 | Discharge: 2015-09-25 | Disposition: A | Discharge status: Home / Self Care | Payer: 59 | Source: Ambulatory Visit | Attending: Gynecologic Oncology | Admitting: Gynecologic Oncology

## 2015-09-25 ENCOUNTER — Ambulatory Visit (HOSPITAL_BASED_OUTPATIENT_CLINIC_OR_DEPARTMENT_OTHER): Payer: 59

## 2015-09-25 ENCOUNTER — Encounter: Payer: Self-pay | Admitting: Gynecologic Oncology

## 2015-09-25 ENCOUNTER — Ambulatory Visit: Payer: 59 | Attending: Gynecologic Oncology | Admitting: Gynecologic Oncology

## 2015-09-25 ENCOUNTER — Other Ambulatory Visit: Payer: Self-pay | Admitting: Gynecologic Oncology

## 2015-09-25 VITALS — BP 125/68 | HR 81 | Temp 98.0°F | Resp 18 | Ht 65.0 in | Wt 115.8 lb

## 2015-09-25 DIAGNOSIS — R569 Unspecified convulsions: Secondary | ICD-10-CM | POA: Diagnosis not present

## 2015-09-25 DIAGNOSIS — Z806 Family history of leukemia: Secondary | ICD-10-CM | POA: Diagnosis not present

## 2015-09-25 DIAGNOSIS — Z8669 Personal history of other diseases of the nervous system and sense organs: Secondary | ICD-10-CM | POA: Diagnosis not present

## 2015-09-25 DIAGNOSIS — Z90710 Acquired absence of both cervix and uterus: Secondary | ICD-10-CM | POA: Insufficient documentation

## 2015-09-25 DIAGNOSIS — Z882 Allergy status to sulfonamides status: Secondary | ICD-10-CM | POA: Insufficient documentation

## 2015-09-25 DIAGNOSIS — Z9049 Acquired absence of other specified parts of digestive tract: Secondary | ICD-10-CM | POA: Diagnosis not present

## 2015-09-25 DIAGNOSIS — Z833 Family history of diabetes mellitus: Secondary | ICD-10-CM | POA: Insufficient documentation

## 2015-09-25 DIAGNOSIS — C541 Malignant neoplasm of endometrium: Secondary | ICD-10-CM

## 2015-09-25 DIAGNOSIS — Z809 Family history of malignant neoplasm, unspecified: Secondary | ICD-10-CM | POA: Insufficient documentation

## 2015-09-25 DIAGNOSIS — Z01411 Encounter for gynecological examination (general) (routine) with abnormal findings: Secondary | ICD-10-CM | POA: Insufficient documentation

## 2015-09-25 DIAGNOSIS — Z87891 Personal history of nicotine dependence: Secondary | ICD-10-CM | POA: Insufficient documentation

## 2015-09-25 DIAGNOSIS — Z8542 Personal history of malignant neoplasm of other parts of uterus: Secondary | ICD-10-CM | POA: Insufficient documentation

## 2015-09-25 DIAGNOSIS — Z880 Allergy status to penicillin: Secondary | ICD-10-CM | POA: Insufficient documentation

## 2015-09-25 DIAGNOSIS — M5030 Other cervical disc degeneration, unspecified cervical region: Secondary | ICD-10-CM | POA: Insufficient documentation

## 2015-09-25 DIAGNOSIS — K769 Liver disease, unspecified: Secondary | ICD-10-CM | POA: Diagnosis not present

## 2015-09-25 DIAGNOSIS — R1032 Left lower quadrant pain: Secondary | ICD-10-CM | POA: Diagnosis not present

## 2015-09-25 DIAGNOSIS — Z1151 Encounter for screening for human papillomavirus (HPV): Secondary | ICD-10-CM | POA: Insufficient documentation

## 2015-09-25 DIAGNOSIS — Z90722 Acquired absence of ovaries, bilateral: Secondary | ICD-10-CM | POA: Insufficient documentation

## 2015-09-25 DIAGNOSIS — Z8249 Family history of ischemic heart disease and other diseases of the circulatory system: Secondary | ICD-10-CM | POA: Diagnosis not present

## 2015-09-25 LAB — URINALYSIS, MICROSCOPIC - CHCC
BILIRUBIN (URINE): NEGATIVE
Glucose: NEGATIVE mg/dL
KETONES: NEGATIVE mg/dL
Leukocyte Esterase: NEGATIVE
Nitrite: NEGATIVE
PH: 7 (ref 4.6–8.0)
Protein: NEGATIVE mg/dL
SPECIFIC GRAVITY, URINE: 1.01 (ref 1.003–1.035)
Urobilinogen, UR: 0.2 mg/dL (ref 0.2–1)

## 2015-09-25 NOTE — Patient Instructions (Addendum)
Follow up with Dr Aldean Ast December 29 , 2017 , we will contact you with your test results . Please call with any additional changes , questions or concerns.  Thank you
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
## 2015-09-29 LAB — CYTOLOGY - PAP

## 2015-09-30 ENCOUNTER — Telehealth: Payer: Self-pay

## 2015-09-30 NOTE — Telephone Encounter (Addendum)
Orders received to contact the patient with PAP "no dysplasia or cancer "and HPV "negative" results . Patient contacted and updated , patient states understanding . Patient states she is still experiencing intermittent pain to her "left" side with nausea. Prior authorization for CT scan is still pending . Patient informed she will be updated soon and we can assist her with scheduling the CT scan ,  Melissa Cross, APNP updated.

## 2015-10-07 ENCOUNTER — Telehealth: Payer: Self-pay

## 2015-10-07 NOTE — Telephone Encounter (Signed)
[name], Brandi Warren received a message from Brandi Warren to refill her dilantin.  LM in patient's  vm oin cell phone stating that she has not seen Brandi Warren since 7-15. The last refill given 07-31-13 notes that refills need to be by PCP. Noted that Brandi Warren has a new patient appointment with Brandi Silversmith, Brandi Warren  [facility] at [facility].  She can request refill then or call that office to see if enough tablets can be sent in to reach the appointment.

## 2015-10-08 ENCOUNTER — Ambulatory Visit (HOSPITAL_COMMUNITY)
Admission: EM | Admit: 2015-10-08 | Discharge: 2015-10-08 | Disposition: A | Discharge status: Home / Self Care | Payer: 59 | Attending: Internal Medicine | Admitting: Internal Medicine

## 2015-10-08 ENCOUNTER — Encounter (HOSPITAL_COMMUNITY): Payer: Self-pay | Admitting: Emergency Medicine

## 2015-10-08 DIAGNOSIS — Z760 Encounter for issue of repeat prescription: Secondary | ICD-10-CM

## 2015-10-08 DIAGNOSIS — G40909 Epilepsy, unspecified, not intractable, without status epilepticus: Secondary | ICD-10-CM | POA: Diagnosis not present

## 2015-10-08 DIAGNOSIS — H9201 Otalgia, right ear: Secondary | ICD-10-CM | POA: Insufficient documentation

## 2015-10-08 DIAGNOSIS — M5030 Other cervical disc degeneration, unspecified cervical region: Secondary | ICD-10-CM | POA: Diagnosis not present

## 2015-10-08 DIAGNOSIS — Z8542 Personal history of malignant neoplasm of other parts of uterus: Secondary | ICD-10-CM | POA: Insufficient documentation

## 2015-10-08 DIAGNOSIS — Z87891 Personal history of nicotine dependence: Secondary | ICD-10-CM | POA: Insufficient documentation

## 2015-10-08 DIAGNOSIS — R011 Cardiac murmur, unspecified: Secondary | ICD-10-CM | POA: Insufficient documentation

## 2015-10-08 DIAGNOSIS — N83209 Unspecified ovarian cyst, unspecified side: Secondary | ICD-10-CM | POA: Insufficient documentation

## 2015-10-08 LAB — PHENYTOIN LEVEL, TOTAL: Phenytoin Lvl: <2.5 ug/mL — ABNORMAL LOW (ref 10.0–20.0)

## 2015-10-08 NOTE — ED Triage Notes (Signed)
Pt c/o intermittent right ear pain onset 1 month  Also needing refill on dilantin   Reports she is finding a new PCP  A&O x4... NAD

## 2015-10-08 NOTE — Discharge Instructions (Signed)
I Dilantin level will be drawn tonight. Another health care provider agreed to look for the results of the level tomorrow and if it is normal or low he will refill your Dilantin.

## 2015-10-08 NOTE — ED Notes (Signed)
Call back number verified and updated in EPIC... Adv pt to not have SI until lab results comeback neg.... Also adv pt lab results will be on MyChart; instructions given .... Pt verb understanding.   
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
## 2015-10-09 ENCOUNTER — Telehealth (HOSPITAL_COMMUNITY): Payer: Self-pay | Admitting: Emergency Medicine

## 2015-10-09 MED ORDER — PHENYTOIN SODIUM EXTENDED 100 MG PO CAPS: 100.0000 mg | ORAL_CAPSULE | Freq: Two times a day (BID) | ORAL | 1 refills | Status: DC

## 2015-10-09 NOTE — Telephone Encounter (Signed)
Pt called to get lab results and to see if we can call in Rx.   Per Lucita Lora, PA....   E-Rx Dilantin 100 mg BID QD  180 dispensed w/no refills.

## 2015-10-12 ENCOUNTER — Ambulatory Visit (HOSPITAL_COMMUNITY): Payer: 59

## 2015-10-13 ENCOUNTER — Ambulatory Visit: Payer: Self-pay | Admitting: Internal Medicine

## 2015-10-14 ENCOUNTER — Other Ambulatory Visit (HOSPITAL_BASED_OUTPATIENT_CLINIC_OR_DEPARTMENT_OTHER): Payer: 59

## 2015-10-14 ENCOUNTER — Telehealth: Payer: Self-pay

## 2015-10-14 ENCOUNTER — Ambulatory Visit (HOSPITAL_COMMUNITY)
Admission: RE | Admit: 2015-10-14 | Discharge: 2015-10-14 | Disposition: A | Discharge status: Home / Self Care | Payer: 59 | Source: Ambulatory Visit | Attending: Gynecologic Oncology | Admitting: Gynecologic Oncology

## 2015-10-14 DIAGNOSIS — R1032 Left lower quadrant pain: Secondary | ICD-10-CM | POA: Diagnosis not present

## 2015-10-14 DIAGNOSIS — C541 Malignant neoplasm of endometrium: Secondary | ICD-10-CM

## 2015-10-14 LAB — CBC WITH DIFFERENTIAL/PLATELET
BASO%: 0.5 % (ref 0.0–2.0)
BASOS ABS: 0 10*3/uL (ref 0.0–0.1)
EOS%: 0 % (ref 0.0–7.0)
Eosinophils Absolute: 0 10*3/uL (ref 0.0–0.5)
HCT: 36.5 % (ref 34.8–46.6)
HEMOGLOBIN: 12.2 g/dL (ref 11.6–15.9)
LYMPH%: 41.6 % (ref 14.0–49.7)
MCH: 31 pg (ref 25.1–34.0)
MCHC: 33.4 g/dL (ref 31.5–36.0)
MCV: 92.6 fL (ref 79.5–101.0)
MONO#: 0.3 10*3/uL (ref 0.1–0.9)
MONO%: 6.4 % (ref 0.0–14.0)
NEUT%: 51.5 % (ref 38.4–76.8)
NEUTROS ABS: 2.1 10*3/uL (ref 1.5–6.5)
Platelets: 186 10*3/uL (ref 145–400)
RBC: 3.94 10*6/uL (ref 3.70–5.45)
RDW: 12.7 % (ref 11.2–14.5)
WBC: 4.1 10*3/uL (ref 3.9–10.3)
lymph#: 1.7 10*3/uL (ref 0.9–3.3)

## 2015-10-14 LAB — BASIC METABOLIC PANEL
ANION GAP: 10 meq/L (ref 3–11)
BUN: 14.3 mg/dL (ref 7.0–26.0)
CO2: 25 mEq/L (ref 22–29)
Calcium: 9.2 mg/dL (ref 8.4–10.4)
Chloride: 107 mEq/L (ref 98–109)
Creatinine: 0.7 mg/dL (ref 0.6–1.1)
GLUCOSE: 87 mg/dL (ref 70–140)
POTASSIUM: 4.2 meq/L (ref 3.5–5.1)
SODIUM: 142 meq/L (ref 136–145)

## 2015-10-14 MED ORDER — IOPAMIDOL (ISOVUE-300) INJECTION 61%
100.0000 mL | Freq: Once | INTRAVENOUS | Status: AC | PRN
  Administered 2015-10-14: 100 mL via INTRAVENOUS

## 2015-10-14 NOTE — Telephone Encounter (Signed)
Orders received from [facility] to contact the patient with CT of Abdomen and Pelvis results being "normal" . Patient contacted and updated , patient states understanding . Patient states she continues to have "abdominal" pain and thinks that she is being "poisoned . Patient states she will follow up with her GI doctor. Patient denies further questions at this time.

## 2015-10-15 LAB — CA 125: Cancer Antigen (CA) 125: 6 U/mL (ref 0.0–38.1)

## 2015-10-15 LAB — CANCER ANTIGEN 125 (PARALLEL TESTING): CA 125: 4 U/mL (ref <35)

## 2015-10-16 ENCOUNTER — Encounter: Payer: Self-pay | Admitting: *Deleted

## 2015-10-22 ENCOUNTER — Encounter: Payer: Self-pay | Admitting: Nurse Practitioner

## 2015-10-22 ENCOUNTER — Ambulatory Visit (INDEPENDENT_AMBULATORY_CARE_PROVIDER_SITE_OTHER): Payer: 59 | Admitting: Nurse Practitioner

## 2015-10-22 VITALS — BP 92/64 | HR 76 | Ht 60.5 in | Wt 117.0 lb

## 2015-10-22 DIAGNOSIS — R1032 Left lower quadrant pain: Secondary | ICD-10-CM | POA: Diagnosis not present
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
## 2015-10-22 NOTE — Patient Instructions (Signed)
Tye Savoy NP will write a note and we will fax it to Dr. Hardin Negus.   Follow up with our office as needed.

## 2015-10-25 NOTE — Progress Notes (Signed)
Reviewed and agree with management plan.  Alanie Syler T. Jhalil Silvera, MD FACG 

## 2016-01-15 ENCOUNTER — Ambulatory Visit: Payer: Self-pay | Admitting: Gynecology

## 2016-04-27 ENCOUNTER — Telehealth: Payer: Self-pay | Admitting: *Deleted

## 2016-04-27 NOTE — Telephone Encounter (Signed)
Patient called back and I scheduled her appt for May 9th. Patient aware

## 2016-04-27 NOTE — Telephone Encounter (Signed)
Patient called and left a message that she needs an appt. I have called her back and left a message to call the office.

## 2016-05-25 ENCOUNTER — Encounter: Payer: Self-pay | Admitting: Gynecologic Oncology

## 2016-05-25 ENCOUNTER — Ambulatory Visit: Payer: 59 | Attending: Gynecologic Oncology | Admitting: Gynecologic Oncology

## 2016-05-25 ENCOUNTER — Ambulatory Visit (HOSPITAL_BASED_OUTPATIENT_CLINIC_OR_DEPARTMENT_OTHER): Payer: 59

## 2016-05-25 VITALS — BP 112/69 | HR 91 | Temp 98.1°F | Resp 18 | Wt 118.8 lb

## 2016-05-25 DIAGNOSIS — Z8542 Personal history of malignant neoplasm of other parts of uterus: Secondary | ICD-10-CM | POA: Diagnosis not present

## 2016-05-25 DIAGNOSIS — C541 Malignant neoplasm of endometrium: Secondary | ICD-10-CM

## 2016-05-25 DIAGNOSIS — R5383 Other fatigue: Secondary | ICD-10-CM

## 2016-05-25 DIAGNOSIS — Z9221 Personal history of antineoplastic chemotherapy: Secondary | ICD-10-CM | POA: Diagnosis not present

## 2016-05-25 DIAGNOSIS — Z87891 Personal history of nicotine dependence: Secondary | ICD-10-CM | POA: Insufficient documentation
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
## 2016-05-25 NOTE — Patient Instructions (Signed)
We will check a CA 125 level and thyroid panel today and call you with the results.  Plan to follow up in six months or sooner if needed.  Please call for any questions or concerns.

## 2016-05-26 ENCOUNTER — Telehealth: Payer: Self-pay

## 2016-05-26 LAB — CA 125: CANCER ANTIGEN (CA) 125: 5.7 U/mL (ref 0.0–38.1)

## 2016-05-26 LAB — TSH: TSH: 1.531 m[IU]/L (ref 0.308–3.960)

## 2016-05-26 NOTE — Telephone Encounter (Signed)
-----   Message from Everitt Amber, MD sent at 05/26/2016  9:31 AM EDT ----- Would you mind letting her know that her CA 125 is normal. I will see her again as scheduled.

## 2016-05-26 NOTE — Telephone Encounter (Signed)
Told Brandi Warren the results of the thyroid funtions and Ca-125 level as noted below by Dr. Denman George.  Pt verbalized understanding.

## 2016-05-26 NOTE — Telephone Encounter (Signed)
Would you mind letting her know that her thyroid function is normal.  Thanks  Terrence Dupont

## 2016-07-05 ENCOUNTER — Encounter: Payer: Self-pay | Admitting: Emergency Medicine

## 2016-07-05 ENCOUNTER — Emergency Department: Payer: 59

## 2016-07-05 ENCOUNTER — Emergency Department
Admission: EM | Admit: 2016-07-05 | Discharge: 2016-07-05 | Disposition: A | Discharge status: Home / Self Care | Payer: 59 | Attending: Emergency Medicine | Admitting: Emergency Medicine

## 2016-07-05 DIAGNOSIS — W010XXA Fall on same level from slipping, tripping and stumbling without subsequent striking against object, initial encounter: Secondary | ICD-10-CM | POA: Insufficient documentation

## 2016-07-05 DIAGNOSIS — M25562 Pain in left knee: Secondary | ICD-10-CM | POA: Diagnosis not present

## 2016-07-05 DIAGNOSIS — Z79899 Other long term (current) drug therapy: Secondary | ICD-10-CM | POA: Insufficient documentation

## 2016-07-05 DIAGNOSIS — Y939 Activity, unspecified: Secondary | ICD-10-CM | POA: Diagnosis not present

## 2016-07-05 DIAGNOSIS — Y999 Unspecified external cause status: Secondary | ICD-10-CM | POA: Insufficient documentation

## 2016-07-05 DIAGNOSIS — Z87891 Personal history of nicotine dependence: Secondary | ICD-10-CM | POA: Insufficient documentation

## 2016-07-05 DIAGNOSIS — W19XXXA Unspecified fall, initial encounter: Secondary | ICD-10-CM

## 2016-07-05 DIAGNOSIS — Y92513 Shop (commercial) as the place of occurrence of the external cause: Secondary | ICD-10-CM | POA: Insufficient documentation

## 2016-07-05 MED ORDER — CYCLOBENZAPRINE HCL 5 MG PO TABS: 5.0000 mg | ORAL_TABLET | Freq: Three times a day (TID) | ORAL | 0 refills | Status: AC | PRN

## 2016-07-05 MED ORDER — TRAMADOL HCL 50 MG PO TABS: 50.0000 mg | ORAL_TABLET | Freq: Four times a day (QID) | ORAL | 0 refills | Status: AC | PRN

## 2016-07-05 MED ORDER — ONDANSETRON 4 MG PO TBDP
4.0000 mg | ORAL_TABLET | Freq: Once | ORAL | Status: AC
  Administered 2016-07-05: 4 mg via ORAL
  Filled 2016-07-05: qty 1

## 2016-07-05 MED ORDER — LIDOCAINE 5 % EX PTCH
1.0000 | MEDICATED_PATCH | CUTANEOUS | Status: DC
  Administered 2016-07-05: 1 via TRANSDERMAL
  Filled 2016-07-05: qty 1

## 2016-07-05 NOTE — ED Notes (Signed)
Pt ambulatory from xray

## 2016-07-05 NOTE — ED Triage Notes (Signed)
Patient ambulatory to triage. Patient states that she slipped on some water and fell. Patient with complaint of left knee pain.
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
## 2016-09-28 ENCOUNTER — Telehealth: Payer: Self-pay | Admitting: Gynecologic Oncology

## 2016-09-28 NOTE — Telephone Encounter (Signed)
Returned to patient about vaginal dryness and soreness.  Advised she could try Luvenia vaginal moisturizer over the counter.  Advised to call with an update after use or to call for any questions or concerns.

## 2016-12-05 ENCOUNTER — Telehealth: Payer: Self-pay | Admitting: *Deleted

## 2016-12-05 ENCOUNTER — Ambulatory Visit: Payer: Self-pay | Admitting: Gynecologic Oncology

## 2016-12-05 NOTE — Telephone Encounter (Signed)
Patient called and rescheduled her appt from today to Monday November 26th at 3:45pm.

## 2016-12-12 ENCOUNTER — Encounter: Payer: Self-pay | Admitting: Gynecologic Oncology

## 2016-12-12 ENCOUNTER — Ambulatory Visit: Payer: 59 | Attending: Gynecologic Oncology | Admitting: Gynecologic Oncology

## 2016-12-12 VITALS — BP 105/65 | HR 76 | Temp 97.8°F | Resp 18 | Ht 65.0 in | Wt 121.5 lb

## 2016-12-12 DIAGNOSIS — N83202 Unspecified ovarian cyst, left side: Secondary | ICD-10-CM | POA: Diagnosis not present

## 2016-12-12 DIAGNOSIS — C541 Malignant neoplasm of endometrium: Secondary | ICD-10-CM | POA: Insufficient documentation

## 2016-12-12 DIAGNOSIS — Z981 Arthrodesis status: Secondary | ICD-10-CM | POA: Diagnosis not present

## 2016-12-12 DIAGNOSIS — Z79899 Other long term (current) drug therapy: Secondary | ICD-10-CM | POA: Diagnosis not present

## 2016-12-12 DIAGNOSIS — Z8542 Personal history of malignant neoplasm of other parts of uterus: Secondary | ICD-10-CM

## 2016-12-12 DIAGNOSIS — R5383 Other fatigue: Secondary | ICD-10-CM

## 2016-12-12 DIAGNOSIS — R319 Hematuria, unspecified: Secondary | ICD-10-CM | POA: Insufficient documentation

## 2016-12-12 DIAGNOSIS — Z87891 Personal history of nicotine dependence: Secondary | ICD-10-CM | POA: Diagnosis not present

## 2016-12-12 NOTE — Patient Instructions (Signed)
Please notify Dr Denman George at phone number [phone] if you notice vaginal bleeding, new pelvic or abdominal pains, bloating, feeling full easy, or a change in bladder or bowel function.   Please return to see Dr Denman George in May, 2019.
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
## 2017-01-11 ENCOUNTER — Ambulatory Visit: Payer: Self-pay | Admitting: Gynecologic Oncology

## 2017-07-16 ENCOUNTER — Encounter (HOSPITAL_COMMUNITY): Payer: Self-pay | Admitting: Emergency Medicine

## 2017-07-16 ENCOUNTER — Ambulatory Visit (HOSPITAL_COMMUNITY)
Admission: EM | Admit: 2017-07-16 | Discharge: 2017-07-16 | Disposition: A | Discharge status: Home / Self Care | Payer: 59 | Attending: Family Medicine | Admitting: Family Medicine

## 2017-07-16 DIAGNOSIS — J988 Other specified respiratory disorders: Secondary | ICD-10-CM | POA: Diagnosis not present

## 2017-07-16 DIAGNOSIS — G8929 Other chronic pain: Secondary | ICD-10-CM | POA: Diagnosis not present

## 2017-07-16 DIAGNOSIS — M542 Cervicalgia: Secondary | ICD-10-CM | POA: Diagnosis not present

## 2017-07-16 DIAGNOSIS — Z8542 Personal history of malignant neoplasm of other parts of uterus: Secondary | ICD-10-CM | POA: Diagnosis not present

## 2017-07-16 DIAGNOSIS — Z8249 Family history of ischemic heart disease and other diseases of the circulatory system: Secondary | ICD-10-CM | POA: Diagnosis not present

## 2017-07-16 DIAGNOSIS — J0100 Acute maxillary sinusitis, unspecified: Secondary | ICD-10-CM | POA: Insufficient documentation

## 2017-07-16 DIAGNOSIS — G40909 Epilepsy, unspecified, not intractable, without status epilepticus: Secondary | ICD-10-CM | POA: Diagnosis not present

## 2017-07-16 DIAGNOSIS — Z882 Allergy status to sulfonamides status: Secondary | ICD-10-CM | POA: Diagnosis not present

## 2017-07-16 DIAGNOSIS — R3129 Other microscopic hematuria: Secondary | ICD-10-CM | POA: Diagnosis not present

## 2017-07-16 DIAGNOSIS — Z87891 Personal history of nicotine dependence: Secondary | ICD-10-CM | POA: Diagnosis not present

## 2017-07-16 DIAGNOSIS — Z836 Family history of other diseases of the respiratory system: Secondary | ICD-10-CM | POA: Diagnosis not present

## 2017-07-16 DIAGNOSIS — R1032 Left lower quadrant pain: Secondary | ICD-10-CM | POA: Insufficient documentation

## 2017-07-16 DIAGNOSIS — Z832 Family history of diseases of the blood and blood-forming organs and certain disorders involving the immune mechanism: Secondary | ICD-10-CM | POA: Diagnosis not present

## 2017-07-16 DIAGNOSIS — N83202 Unspecified ovarian cyst, left side: Secondary | ICD-10-CM | POA: Insufficient documentation

## 2017-07-16 DIAGNOSIS — Z833 Family history of diabetes mellitus: Secondary | ICD-10-CM | POA: Insufficient documentation

## 2017-07-16 DIAGNOSIS — Z9889 Other specified postprocedural states: Secondary | ICD-10-CM | POA: Diagnosis not present

## 2017-07-16 DIAGNOSIS — Z79899 Other long term (current) drug therapy: Secondary | ICD-10-CM | POA: Insufficient documentation

## 2017-07-16 DIAGNOSIS — Z90710 Acquired absence of both cervix and uterus: Secondary | ICD-10-CM | POA: Diagnosis not present

## 2017-07-16 DIAGNOSIS — H9209 Otalgia, unspecified ear: Secondary | ICD-10-CM | POA: Diagnosis present

## 2017-07-16 DIAGNOSIS — Z9049 Acquired absence of other specified parts of digestive tract: Secondary | ICD-10-CM | POA: Diagnosis not present

## 2017-07-16 DIAGNOSIS — Z809 Family history of malignant neoplasm, unspecified: Secondary | ICD-10-CM | POA: Insufficient documentation

## 2017-07-16 LAB — POCT URINALYSIS DIP (DEVICE)
BILIRUBIN URINE: NEGATIVE
Glucose, UA: NEGATIVE mg/dL
Ketones, ur: NEGATIVE mg/dL
LEUKOCYTES UA: NEGATIVE
NITRITE: NEGATIVE
Protein, ur: NEGATIVE mg/dL
Specific Gravity, Urine: 1.01 (ref 1.005–1.030)
Urobilinogen, UA: 0.2 mg/dL (ref 0.0–1.0)
pH: 6 (ref 5.0–8.0)

## 2017-07-16 MED ORDER — AMOXICILLIN-POT CLAVULANATE 875-125 MG PO TABS: 1.0000 | ORAL_TABLET | Freq: Two times a day (BID) | ORAL | 0 refills | Status: DC

## 2017-07-16 NOTE — Discharge Instructions (Signed)
Increase fluid intake. Take antibiotic twice a day. Consider taking cetirizine at half dose to reduce drowsiness. I have sent your urine for culture.  We will notify you if it is positive.

## 2017-07-16 NOTE — ED Triage Notes (Signed)
Pt has multiple symptoms, states she has sinus issues, ear pain, also her urine is dark.
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
## 2017-07-17 LAB — URINE CULTURE
Culture: NO GROWTH
SPECIAL REQUESTS: NORMAL

## 2017-08-23 ENCOUNTER — Other Ambulatory Visit: Payer: Self-pay

## 2017-08-23 ENCOUNTER — Encounter (HOSPITAL_BASED_OUTPATIENT_CLINIC_OR_DEPARTMENT_OTHER): Payer: Self-pay | Admitting: *Deleted

## 2017-08-24 ENCOUNTER — Other Ambulatory Visit: Payer: Self-pay | Admitting: Otolaryngology

## 2017-08-24 DIAGNOSIS — Q892 Congenital malformations of other endocrine glands: Secondary | ICD-10-CM
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
## 2017-08-28 ENCOUNTER — Ambulatory Visit (HOSPITAL_BASED_OUTPATIENT_CLINIC_OR_DEPARTMENT_OTHER)
Admission: RE | Admit: 2017-08-28 | Discharge: 2017-08-28 | Disposition: A | Discharge status: Home / Self Care | Payer: 59 | Source: Ambulatory Visit | Attending: Otolaryngology | Admitting: Otolaryngology

## 2017-08-28 ENCOUNTER — Ambulatory Visit (HOSPITAL_BASED_OUTPATIENT_CLINIC_OR_DEPARTMENT_OTHER): Payer: 59 | Admitting: Anesthesiology

## 2017-08-28 ENCOUNTER — Other Ambulatory Visit: Payer: Self-pay

## 2017-08-28 ENCOUNTER — Encounter (HOSPITAL_BASED_OUTPATIENT_CLINIC_OR_DEPARTMENT_OTHER): Payer: Self-pay | Admitting: Anesthesiology

## 2017-08-28 ENCOUNTER — Encounter (HOSPITAL_BASED_OUTPATIENT_CLINIC_OR_DEPARTMENT_OTHER)
Admission: RE | Disposition: A | Discharge status: Home / Self Care | Payer: Self-pay | Source: Ambulatory Visit | Attending: Otolaryngology

## 2017-08-28 DIAGNOSIS — Q892 Congenital malformations of other endocrine glands: Secondary | ICD-10-CM | POA: Diagnosis not present

## 2017-08-28 DIAGNOSIS — Z8542 Personal history of malignant neoplasm of other parts of uterus: Secondary | ICD-10-CM | POA: Diagnosis not present

## 2017-08-28 DIAGNOSIS — Z87891 Personal history of nicotine dependence: Secondary | ICD-10-CM | POA: Diagnosis not present

## 2017-08-28 DIAGNOSIS — R221 Localized swelling, mass and lump, neck: Secondary | ICD-10-CM | POA: Diagnosis present

## 2017-08-28 HISTORY — PX: THYROGLOSSAL DUCT CYST: SHX297

## 2017-08-28 SURGERY — EXCISION, THYROGLOSSAL DUCT CYST
Anesthesia: General | Site: Neck | Laterality: Right

## 2017-08-28 MED ORDER — FENTANYL CITRATE (PF) 100 MCG/2ML IJ SOLN
INTRAMUSCULAR | Status: AC
Start: 2017-08-28 — End: ?
  Filled 2017-08-28: qty 2

## 2017-08-28 MED ORDER — MIDAZOLAM HCL 2 MG/2ML IJ SOLN
1.0000 mg | INTRAMUSCULAR | Status: DC | PRN
  Administered 2017-08-28: 2 mg via INTRAVENOUS

## 2017-08-28 MED ORDER — LACTATED RINGERS IV SOLN
INTRAVENOUS | Status: DC
  Administered 2017-08-28 (×2): via INTRAVENOUS

## 2017-08-28 MED ORDER — ONDANSETRON HCL 4 MG/2ML IJ SOLN
INTRAMUSCULAR | Status: AC
  Filled 2017-08-28: qty 2

## 2017-08-28 MED ORDER — FENTANYL CITRATE (PF) 100 MCG/2ML IJ SOLN
25.0000 ug | INTRAMUSCULAR | Status: DC | PRN
  Administered 2017-08-28 (×2): 25 ug via INTRAVENOUS

## 2017-08-28 MED ORDER — PROMETHAZINE HCL 25 MG RE SUPP: 25.0000 mg | Freq: Four times a day (QID) | RECTAL | 1 refills | Status: DC | PRN

## 2017-08-28 MED ORDER — DEXAMETHASONE SODIUM PHOSPHATE 10 MG/ML IJ SOLN
INTRAMUSCULAR | Status: AC
  Filled 2017-08-28: qty 1

## 2017-08-28 MED ORDER — PROPOFOL 10 MG/ML IV BOLUS
INTRAVENOUS | Status: AC
  Filled 2017-08-28: qty 20

## 2017-08-28 MED ORDER — LIDOCAINE-EPINEPHRINE 1 %-1:100000 IJ SOLN
INTRAMUSCULAR | Status: DC | PRN
  Administered 2017-08-28: 2 mL

## 2017-08-28 MED ORDER — MIDAZOLAM HCL 2 MG/2ML IJ SOLN
INTRAMUSCULAR | Status: AC
  Filled 2017-08-28: qty 2

## 2017-08-28 MED ORDER — DEXAMETHASONE SODIUM PHOSPHATE 4 MG/ML IJ SOLN
INTRAMUSCULAR | Status: DC | PRN
  Administered 2017-08-28: 10 mg via INTRAVENOUS

## 2017-08-28 MED ORDER — ONDANSETRON HCL 4 MG/2ML IJ SOLN
INTRAMUSCULAR | Status: DC | PRN
  Administered 2017-08-28: 4 mg via INTRAVENOUS

## 2017-08-28 MED ORDER — HYDROCODONE-ACETAMINOPHEN 7.5-325 MG PO TABS: 1.0000 | ORAL_TABLET | Freq: Four times a day (QID) | ORAL | 0 refills | Status: DC | PRN

## 2017-08-28 MED ORDER — PROPOFOL 10 MG/ML IV BOLUS
INTRAVENOUS | Status: DC | PRN
  Administered 2017-08-28: 180 mg via INTRAVENOUS

## 2017-08-28 MED ORDER — LIDOCAINE 2% (20 MG/ML) 5 ML SYRINGE
INTRAMUSCULAR | Status: DC | PRN
  Administered 2017-08-28: 100 mg via INTRAVENOUS

## 2017-08-28 MED ORDER — FENTANYL CITRATE (PF) 100 MCG/2ML IJ SOLN
50.0000 ug | INTRAMUSCULAR | Status: DC | PRN
  Administered 2017-08-28: 100 ug via INTRAVENOUS
  Administered 2017-08-28: 50 ug via INTRAVENOUS

## 2017-08-28 MED ORDER — SCOPOLAMINE 1 MG/3DAYS TD PT72: 1.0000 | MEDICATED_PATCH | Freq: Once | TRANSDERMAL | Status: DC | PRN

## 2017-08-28 MED ORDER — FENTANYL CITRATE (PF) 100 MCG/2ML IJ SOLN
INTRAMUSCULAR | Status: AC
  Filled 2017-08-28: qty 2

## 2017-08-28 SURGICAL SUPPLY — 57 items
ADH SKN CLS APL DERMABOND .7 (GAUZE/BANDAGES/DRESSINGS) ×1
APL SKNCLS STERI-STRIP NONHPOA (GAUZE/BANDAGES/DRESSINGS)
ATTRACTOMAT 16X20 MAGNETIC DRP (DRAPES) IMPLANT
BENZOIN TINCTURE PRP APPL 2/3 (GAUZE/BANDAGES/DRESSINGS) IMPLANT
BLADE SURG 15 STRL LF DISP TIS (BLADE) ×1 IMPLANT
BLADE SURG 15 STRL SS (BLADE) ×2
CANISTER SUCT 1200ML W/VALVE (MISCELLANEOUS) ×2 IMPLANT
CLEANER CAUTERY TIP 5X5 PAD (MISCELLANEOUS) IMPLANT
CORD BIPOLAR FORCEPS 12FT (ELECTRODE) IMPLANT
COVER BACK TABLE 60X90IN (DRAPES) ×2 IMPLANT
COVER MAYO STAND STRL (DRAPES) ×2 IMPLANT
DECANTER SPIKE VIAL GLASS SM (MISCELLANEOUS) IMPLANT
DERMABOND ADVANCED (GAUZE/BANDAGES/DRESSINGS) ×1
DERMABOND ADVANCED .7 DNX12 (GAUZE/BANDAGES/DRESSINGS) IMPLANT
DRAIN PENROSE 1/4X12 LTX STRL (WOUND CARE) IMPLANT
DRAPE U-SHAPE 76X120 STRL (DRAPES) ×2 IMPLANT
DRSG EMULSION OIL 3X3 NADH (GAUZE/BANDAGES/DRESSINGS) IMPLANT
ELECT COATED BLADE 2.86 ST (ELECTRODE) ×2 IMPLANT
ELECT REM PT RETURN 9FT ADLT (ELECTROSURGICAL) ×2
ELECTRODE REM PT RTRN 9FT ADLT (ELECTROSURGICAL) ×1 IMPLANT
GAUZE 4X4 16PLY RFD (DISPOSABLE) IMPLANT
GAUZE SPONGE 4X4 12PLY STRL LF (GAUZE/BANDAGES/DRESSINGS) IMPLANT
GLOVE ECLIPSE 7.5 STRL STRAW (GLOVE) ×2 IMPLANT
GLOVE EXAM NITRILE MD LF STRL (GLOVE) ×1 IMPLANT
GLOVE SURG SS PI 7.0 STRL IVOR (GLOVE) ×1 IMPLANT
GOWN STRL REUS W/ TWL LRG LVL3 (GOWN DISPOSABLE) ×1 IMPLANT
GOWN STRL REUS W/TWL LRG LVL3 (GOWN DISPOSABLE) ×4
NDL PRECISIONGLIDE 27X1.5 (NEEDLE) ×1 IMPLANT
NEEDLE PRECISIONGLIDE 27X1.5 (NEEDLE) ×2 IMPLANT
NS IRRIG 1000ML POUR BTL (IV SOLUTION) ×2 IMPLANT
PACK BASIN DAY SURGERY FS (CUSTOM PROCEDURE TRAY) ×2 IMPLANT
PAD CLEANER CAUTERY TIP 5X5 (MISCELLANEOUS) ×1
PENCIL FOOT CONTROL (ELECTRODE) ×2 IMPLANT
RUBBERBAND STERILE (MISCELLANEOUS) ×1 IMPLANT
SHEET MEDIUM DRAPE 40X70 STRL (DRAPES) IMPLANT
SLEEVE SCD COMPRESS KNEE MED (MISCELLANEOUS) ×2 IMPLANT
SPONGE INTESTINAL PEANUT (DISPOSABLE) IMPLANT
STAPLER VISISTAT 35W (STAPLE) IMPLANT
STRIP CLOSURE SKIN 1/2X4 (GAUZE/BANDAGES/DRESSINGS) IMPLANT
STRIP CLOSURE SKIN 1/4X4 (GAUZE/BANDAGES/DRESSINGS) IMPLANT
SUCTION FRAZIER HANDLE 10FR (MISCELLANEOUS)
SUCTION TUBE FRAZIER 10FR DISP (MISCELLANEOUS) IMPLANT
SUT CHROMIC 3 0 PS 2 (SUTURE) ×1 IMPLANT
SUT CHROMIC 4 0 P 3 18 (SUTURE) IMPLANT
SUT ETHILON 5 0 P 3 18 (SUTURE)
SUT NYLON ETHILON 5-0 P-3 1X18 (SUTURE) IMPLANT
SUT SILK 3 0 TIES 17X18 (SUTURE)
SUT SILK 3-0 18XBRD TIE BLK (SUTURE) IMPLANT
SUT SILK 4 0 TIES 17X18 (SUTURE) ×2 IMPLANT
SWAB COLLECTION DEVICE MRSA (MISCELLANEOUS) IMPLANT
SWAB CULTURE ESWAB REG 1ML (MISCELLANEOUS) IMPLANT
SYR BULB 3OZ (MISCELLANEOUS) ×2 IMPLANT
SYR CONTROL 10ML LL (SYRINGE) ×2 IMPLANT
TAPE PAPER 3X10 WHT MICROPORE (GAUZE/BANDAGES/DRESSINGS) ×1 IMPLANT
TOWEL GREEN STERILE FF (TOWEL DISPOSABLE) ×2 IMPLANT
TRAY DSU PREP LF (CUSTOM PROCEDURE TRAY) ×2 IMPLANT
TUBE CONNECTING 20X1/4 (TUBING) ×2 IMPLANT

## 2017-08-28 NOTE — Op Note (Signed)
OPERATIVE REPORT  DATE OF SURGERY: 08/28/2017  PATIENT:  Brandi Warren,  57 y.o. female  PRE-OPERATIVE DIAGNOSIS:  thyroglossal duct cyst  POST-OPERATIVE DIAGNOSIS:  thyroglossal duct cyst  PROCEDURE:  Procedure(s): EXCISION OF THYROGLOSSAL DUCT CYST  SURGEON:  Beckie Salts, MD  ASSISTANTS: None  ANESTHESIA:   General   EBL: 20 ml  DRAINS: Sterile rubber band  LOCAL MEDICATIONS USED: 1% Xylocaine with epinephrine  SPECIMEN: Anterior neck cyst with associated hyoid bone  COUNTS:  Correct  PROCEDURE DETAILS: The patient was taken to the operating room and placed on the operating table in the supine position. Following induction of general endotracheal anesthesia, the neck was prepped and draped in a standard fashion.  A transverse incision was outlined in a skin crease about 2 cm below the cyst.  Local anesthetic was infiltrated.  Electrocautery was used to incise the skin and subcutaneous tissue.  Dissection continued down through the superficial fascia exposing the cystic mass.  The cyst was dissected off of the surrounding musculature and the thyrohyoid membrane.  Dissection continued up to the hyoid bone which was then dissected free of muscular attachments and the central portion was removed using bone cutting rongeurs.  This was Attached to the specimen.  Deeper dissection exposed healthy tissue and no identifiable cyst or tract extending to the tongue base.  The entire specimen was removed and sent for pathologic evaluation.  Electrocautery was used for completion of hemostasis.  The wound was irrigated with saline.  Closure was accomplished using interrupted 3-0 chromic on the deep layer, similar closure on the fascia, and then running subcuticular 3-0 chromic on the skin.  Dermabond was used on the surface.  A rubber band was placed into the depth of the wound exited through the right side of the incision and a dressing was applied.  Patient was awakened extubated and  transferred to recovery in stable condition.    PATIENT DISPOSITION:  To PACU, stable

## 2017-08-28 NOTE — Anesthesia Preprocedure Evaluation (Signed)
Anesthesia Evaluation  Patient identified by MRN, date of birth, ID band Patient awake    Reviewed: Allergy & Precautions, NPO status , Patient's Chart, lab work & pertinent test results  Airway Mallampati: II  TM Distance: >3 FB     Dental   Pulmonary former smoker,    breath sounds clear to auscultation       Cardiovascular + Valvular Problems/Murmurs  Rhythm:Regular Rate:Normal     Neuro/Psych    GI/Hepatic negative GI ROS, Neg liver ROS,   Endo/Other  negative endocrine ROS  Renal/GU negative Renal ROS     Musculoskeletal  (+) Arthritis ,   Abdominal   Peds  Hematology   Anesthesia Other Findings   Reproductive/Obstetrics                             Anesthesia Physical Anesthesia Plan  ASA: II  Anesthesia Plan: General   Post-op Pain Management:    Induction: Intravenous  PONV Risk Score and Plan: Treatment may vary due to age or medical condition, Ondansetron, Dexamethasone and Midazolam  Airway Management Planned: LMA  Additional Equipment:   Intra-op Plan:   Post-operative Plan: Extubation in OR  Informed Consent: I have reviewed the patients History and Physical, chart, labs and discussed the procedure including the risks, benefits and alternatives for the proposed anesthesia with the patient or authorized representative who has indicated his/her understanding and acceptance.   Dental advisory given  Plan Discussed with: CRNA and Anesthesiologist  Anesthesia Plan Comments:         Anesthesia Quick Evaluation

## 2017-08-28 NOTE — Discharge Instructions (Signed)
°  Post Anesthesia Home Care Instructions  Activity: Get plenty of rest for the remainder of the day. A responsible individual must stay with you for 24 hours following the procedure.  For the next 24 hours, DO NOT: -Drive a car -Paediatric nurse -Drink alcoholic beverages -Take any medication unless instructed by your physician -Make any legal decisions or sign important papers.  Meals: Start with liquid foods such as gelatin or soup. Progress to regular foods as tolerated. Avoid greasy, spicy, heavy foods. If nausea and/or vomiting occur, drink only clear liquids until the nausea and/or vomiting subsides. Call your physician if vomiting continues.  Special Instructions/Symptoms: Your throat may feel dry or sore from the anesthesia or the breathing tube placed in your throat during surgery. If this causes discomfort, gargle with warm salt water. The discomfort should disappear within 24 hours.  If you had a scopolamine patch placed behind your ear for the management of post- operative nausea and/or vomiting:  1. The medication in the patch is effective for 72 hours, after which it should be removed.  Wrap patch in a tissue and discard in the trash. Wash hands thoroughly with soap and water. 2. You may remove the patch earlier than 72 hours if you experience unpleasant side effects which may include dry mouth, dizziness or visual disturbances. 3. Avoid touching the patch. Wash your hands with soap and water after contact with the patch.      You may shower and use soap and water. Do not use any creams, oils or ointment     Please call office today to schedule at followup appointment for 08/29/17  Leave dressing on and do not get it wet.   Call Dr,. Rosen for any problems,

## 2017-08-28 NOTE — Interval H&P Note (Signed)
History and Physical Interval Note:  08/28/2017 8:11 AM  Brandi Warren  has presented today for surgery, with the diagnosis of thyroglossal duct cyst  The various methods of treatment have been discussed with the patient and family. After consideration of risks, benefits and other options for treatment, the patient has consented to  Procedure(s): THYROGLOSSAL DUCT CYST (N/A) as a surgical intervention .  The patient's history has been reviewed, patient examined, no change in status, stable for surgery.  I have reviewed the patient's chart and labs.  Questions were answered to the patient's satisfaction.     Izora Gala

## 2017-08-28 NOTE — Transfer of Care (Signed)
Immediate Anesthesia Transfer of Care Note  Patient: Brandi Warren  Procedure(s) Performed: EXCISION OF THYROGLOSSAL DUCT CYST (Right Neck)  Patient Location: PACU  Anesthesia Type:General  Level of Consciousness: sedated  Airway & Oxygen Therapy: Patient Spontanous Breathing and Patient connected to face mask oxygen  Post-op Assessment: Report given to RN and Post -op Vital signs reviewed and stable  Post vital signs: Reviewed and stable  Last Vitals:  Vitals Value Taken Time  BP 114/77 08/28/2017  9:26 AM  Temp    Pulse 78 08/28/2017  9:27 AM  Resp 16 08/28/2017  9:27 AM  SpO2 100 % 08/28/2017  9:27 AM  Vitals shown include unvalidated device data.  Last Pain:  Vitals:   08/28/17 0730  TempSrc: Oral  PainSc: 0-No pain      Patients Stated Pain Goal: 4 (56/38/93 [id number])  Complications: No apparent anesthesia complications

## 2017-08-28 NOTE — Anesthesia Postprocedure Evaluation (Signed)
Anesthesia Post Note  Patient: Brandi Warren  Procedure(s) Performed: EXCISION OF THYROGLOSSAL DUCT CYST (Right Neck)     Patient location during evaluation: PACU Anesthesia Type: General Level of consciousness: awake Pain management: pain level controlled Vital Signs Assessment: post-procedure vital signs reviewed and stable Respiratory status: spontaneous breathing Cardiovascular status: stable Postop Assessment: no apparent nausea or vomiting Anesthetic complications: no    Last Vitals:  Vitals:   08/28/17 0930 08/28/17 0945  BP: 121/73 130/72  Pulse: 75 69  Resp: 10 13  Temp:    SpO2: 100% 100%    Last Pain:  Vitals:   08/28/17 0926  TempSrc:   PainSc: 0-No pain                 Nettye Flegal

## 2017-08-28 NOTE — Anesthesia Procedure Notes (Signed)
Procedure Name: LMA Insertion Date/Time: 08/28/2017 8:36 AM Performed by: Maryella Shivers, CRNA Pre-anesthesia Checklist: Patient identified, Emergency Drugs available, Suction available and Patient being monitored Patient Re-evaluated:Patient Re-evaluated prior to induction Oxygen Delivery Method: Circle system utilized Preoxygenation: Pre-oxygenation with 100% oxygen Induction Type: IV induction Ventilation: Mask ventilation without difficulty LMA: LMA inserted LMA Size: 4.0 Number of attempts: 1 Airway Equipment and Method: Bite block Placement Confirmation: positive ETCO2 Tube secured with: Tape Dental Injury: Teeth and Oropharynx as per pre-operative assessment

## 2017-08-29 ENCOUNTER — Encounter (HOSPITAL_BASED_OUTPATIENT_CLINIC_OR_DEPARTMENT_OTHER): Payer: Self-pay | Admitting: Otolaryngology

## 2017-08-30 NOTE — Discharge Summary (Signed)
Physician Discharge Summary  Patient ID: Brandi Warren MRN: [id number] DOB/AGE: 55-12-1960 55 y.o.  Admit date: 08/28/2017 Discharge date: 08/30/2017  Admission Diagnoses: Thyroglossal duct cyst  Discharge Diagnoses:  Active Problems:   * No active hospital problems. *   Discharged Condition: good  Hospital Course: No complications  Consults: none  Significant Diagnostic Studies: none  Treatments: surgery: Sistrunk procedure  Discharge Exam: Blood pressure 134/71, pulse 62, temperature 97.8 F (36.6 C), temperature source Oral, resp. rate 16, height 5\' 5"  (1.651 m), weight 56.7 kg, last menstrual period 01/23/2013, SpO2 99 %. PHYSICAL EXAM: Awake and alert, voice normal, tongue mobility normal.  Incision intact with drain in place and dressing in place.  No swelling or infection.  Disposition:   Discharge Instructions    Diet - low sodium heart healthy   Complete by:  As directed    Increase activity slowly   Complete by:  As directed      Allergies as of 08/28/2017      Reactions   Sulfa Antibiotics Other (See Comments)   seizures      Medication List    TAKE these medications   HYDROcodone-acetaminophen 7.5-325 MG tablet Commonly known as:  NORCO Take 1 tablet by mouth every 6 (six) hours as needed for moderate pain.   phenytoin 100 MG ER capsule Commonly known as:  DILANTIN Take 1 capsule (100 mg total) by mouth 2 (two) times daily.   promethazine 25 MG suppository Commonly known as:  PHENERGAN Place 1 suppository (25 mg total) rectally every 6 (six) hours as needed for nausea or vomiting.      Follow-up Information    Izora Gala, MD. Schedule an appointment as soon as possible for a visit in 1 week.   Specialty:  Otolaryngology Contact information: [address] [place] [address] [phone]           Signed: Izora Gala 08/30/2017, 9:15 AM

## 2017-10-24 ENCOUNTER — Telehealth: Payer: Self-pay | Admitting: *Deleted

## 2017-10-24 NOTE — Telephone Encounter (Signed)
Returned patient's phone call concerning her appointment.   I left patient a message  Per patient request on her phone and told her that her appointment is scheduled for Monday, October 30, 2017 at 3:45pm with Dr. Denman George.

## 2017-10-30 ENCOUNTER — Ambulatory Visit: Payer: Self-pay | Admitting: Gynecologic Oncology
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
## 2017-11-20 ENCOUNTER — Inpatient Hospital Stay (HOSPITAL_BASED_OUTPATIENT_CLINIC_OR_DEPARTMENT_OTHER): Payer: 59 | Admitting: Gynecologic Oncology

## 2017-11-20 ENCOUNTER — Inpatient Hospital Stay: Payer: 59 | Attending: Gynecologic Oncology

## 2017-11-20 ENCOUNTER — Other Ambulatory Visit: Payer: Self-pay | Admitting: *Deleted

## 2017-11-20 ENCOUNTER — Encounter: Payer: Self-pay | Admitting: Gynecologic Oncology

## 2017-11-20 VITALS — BP 116/80 | HR 76 | Temp 98.4°F | Resp 18 | Ht 65.0 in | Wt 125.0 lb

## 2017-11-20 DIAGNOSIS — Z9221 Personal history of antineoplastic chemotherapy: Secondary | ICD-10-CM | POA: Insufficient documentation

## 2017-11-20 DIAGNOSIS — Z90722 Acquired absence of ovaries, bilateral: Secondary | ICD-10-CM | POA: Diagnosis not present

## 2017-11-20 DIAGNOSIS — R319 Hematuria, unspecified: Secondary | ICD-10-CM

## 2017-11-20 DIAGNOSIS — Z87891 Personal history of nicotine dependence: Secondary | ICD-10-CM | POA: Diagnosis not present

## 2017-11-20 DIAGNOSIS — Z8542 Personal history of malignant neoplasm of other parts of uterus: Secondary | ICD-10-CM

## 2017-11-20 DIAGNOSIS — Z08 Encounter for follow-up examination after completed treatment for malignant neoplasm: Secondary | ICD-10-CM

## 2017-11-20 DIAGNOSIS — R300 Dysuria: Secondary | ICD-10-CM

## 2017-11-20 DIAGNOSIS — Z90710 Acquired absence of both cervix and uterus: Secondary | ICD-10-CM | POA: Insufficient documentation

## 2017-11-20 DIAGNOSIS — C541 Malignant neoplasm of endometrium: Secondary | ICD-10-CM

## 2017-11-20 LAB — URINALYSIS, COMPLETE (UACMP) WITH MICROSCOPIC
Bilirubin Urine: NEGATIVE
GLUCOSE, UA: NEGATIVE mg/dL
Ketones, ur: 5 mg/dL — AB
Leukocytes, UA: NEGATIVE
NITRITE: NEGATIVE
PH: 5 (ref 5.0–8.0)
PROTEIN: NEGATIVE mg/dL
SPECIFIC GRAVITY, URINE: 1.027 (ref 1.005–1.030)
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
## 2017-11-20 NOTE — Patient Instructions (Addendum)
Please notify Dr Denman George at phone number [phone] if you notice vaginal bleeding, new pelvic or abdominal pains, bloating, feeling full easy, or a change in bladder or bowel function.   Please return to see Dr Denman George in April, 2020.   Dr Denman George has sent a specimen for urine culture and will notify you if antibiotics are recommended.

## 2017-11-21 ENCOUNTER — Telehealth: Payer: Self-pay

## 2017-11-21 LAB — URINE CULTURE

## 2017-11-21 NOTE — Telephone Encounter (Signed)
ENCOUNTER OPENED IN ERROR

## 2017-11-21 NOTE — Telephone Encounter (Signed)
LM in VM stating that the urine culture showed that she did not have a UTI. Joylene John, NP recommended that she make an appointment to see a urologist.

## 2017-11-21 NOTE — Telephone Encounter (Signed)
Spoke with Ms Kopf and told her that The urinalysis did not definitively show a UTI. Will wait on culture results. The UA showed a moderate amount of blood in her urine. Inquired if she ever saw urology as noted that PCP was working her up for hematuria in Dr. Serita Grit note 12-12-17.Pt stated that she self referred and had to cancel appointments  Due to family illness. Suggested that her PCP refer or GYN Oncolgy refer her for hematuria.  Pt said that she will call [facility] herself to make an appointment.

## 2017-11-21 NOTE — Telephone Encounter (Signed)
LM for pt to call to discuss urinalysis results.

## 2017-11-22 NOTE — Telephone Encounter (Signed)
Pt Called stating that she has an appointment with [facility] on 12-19-17 to f/u on Hematuria.

## 2018-04-19 ENCOUNTER — Telehealth: Payer: Self-pay | Admitting: *Deleted

## 2018-04-19 NOTE — Telephone Encounter (Signed)
Attempted to contact the patient regarding her app on 4/24. Left a message for the patient to call the office back. Need to move appt out several weeks due to COVID-19.

## 2018-04-20 NOTE — Telephone Encounter (Signed)
Patient called back yesterday and left a message to cll her back. Stated that "I'm working from home if I don't aanswer you can leave a message."  Called the patient this morning and left a message with the new appt date/time. Explained that she can call the office back with any questions

## 2018-05-11 ENCOUNTER — Ambulatory Visit: Payer: Self-pay | Admitting: Gynecologic Oncology

## 2018-06-27 ENCOUNTER — Inpatient Hospital Stay: Payer: 59 | Attending: Gynecologic Oncology | Admitting: Gynecologic Oncology

## 2019-02-21 ENCOUNTER — Other Ambulatory Visit: Payer: Self-pay

## 2019-02-21 ENCOUNTER — Encounter: Payer: Self-pay | Admitting: Nurse Practitioner

## 2019-02-21 ENCOUNTER — Ambulatory Visit (INDEPENDENT_AMBULATORY_CARE_PROVIDER_SITE_OTHER): Payer: 59 | Admitting: Nurse Practitioner

## 2019-02-21 VITALS — BP 112/64 | HR 80 | Temp 98.2°F | Ht 60.0 in | Wt 114.0 lb

## 2019-02-21 DIAGNOSIS — R1032 Left lower quadrant pain: Secondary | ICD-10-CM | POA: Diagnosis not present

## 2019-02-21 DIAGNOSIS — G8929 Other chronic pain: Secondary | ICD-10-CM | POA: Diagnosis not present

## 2019-02-21 MED ORDER — DICYCLOMINE HCL 20 MG PO TABS: 20.0000 mg | ORAL_TABLET | Freq: Two times a day (BID) | ORAL | 0 refills | Status: DC

## 2019-02-21 NOTE — Patient Instructions (Signed)
If you are age 57 or older, your body mass index should be between 23-30. Your Body mass index is 22.26 kg/m. If this is out of the aforementioned range listed, please consider follow up with your Primary Care Provider.  If you are age 4 or younger, your body mass index should be between 19-25. Your Body mass index is 22.26 kg/m. If this is out of the aformentioned range listed, please consider follow up with your Primary Care Provider.   We have sent the following medications to your pharmacy for you to pick up at your convenience: Bentyl 20 mg  If this helps, then after one week take as needed.  Thank you for choosing me and [facility].   Tye Savoy, NP
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
## 2019-02-22 NOTE — Progress Notes (Signed)
Reviewed and agree with management plan.  Jaskiran Pata T. Azam Gervasi, MD FACG [facility]  

## 2019-09-13 ENCOUNTER — Ambulatory Visit: Payer: 59 | Admitting: Gynecologic Oncology

## 2019-09-13 ENCOUNTER — Telehealth: Payer: Self-pay | Admitting: *Deleted

## 2019-09-13 NOTE — Telephone Encounter (Signed)
Patient called and canceled her appt for today. Patient stated that she would call back to reschedule. Patient not feeling well

## 2020-01-01 ENCOUNTER — Encounter: Payer: Self-pay | Admitting: Gynecologic Oncology

## 2020-02-28 ENCOUNTER — Encounter: Payer: Self-pay | Admitting: Gastroenterology

## 2020-03-13 ENCOUNTER — Encounter: Payer: Self-pay | Admitting: Gastroenterology

## 2020-04-17 ENCOUNTER — Telehealth: Payer: Self-pay | Admitting: *Deleted

## 2020-04-17 NOTE — Telephone Encounter (Signed)
Sheri,  Can you please schedule this pt at [facility] per Dr Fuller Plan  - diff intubation   Thanks  Lelan Pons

## 2020-04-17 NOTE — Telephone Encounter (Signed)
Please schedule at [facility] on one of my outpatient blocks. Thx.

## 2020-04-17 NOTE — Telephone Encounter (Signed)
Brandi Warren,  Good pick up; this pt's procedure will need to be done at the hospital.  Thanks,  Osvaldo Angst

## 2020-04-17 NOTE — Telephone Encounter (Signed)
Dr Fuller Plan and Jenny Reichmann,  In reviewing this pt's chart for a recall colon I found in her Surgical notes she had a past difficult intubation.  See below:  Procedure Name: Intubation Date/Time: 02/19/2013 9:00 AM Performed by: Ofilia Neas Pre-anesthesia Checklist: Patient identified, Patient being monitored, Timeout performed, Emergency Drugs available and Suction available Patient Re-evaluated:Patient Re-evaluated prior to inductionOxygen Delivery Method: Circle system utilized Preoxygenation: Pre-oxygenation with 100% oxygen (head/neck in comfortable position per pt.) Intubation Type: IV induction and Cricoid Pressure applied Ventilation: Mask ventilation without difficulty Laryngoscope Size: Mac and 3 Grade View: Grade II Tube type: Oral Tube size: 7.5 mm Number of attempts: 1 Airway Equipment and Method: Stylet Placement Confirmation: ETT inserted through vocal cords under direct vision,  positive ETCO2 and breath sounds checked- equal and bilateral Secured at: 21 cm Tube secured with: Tape Dental Injury: Teeth and Oropharynx as per pre-operative assessment  Difficulty Due To: Difficulty was anticipated, Difficult Airway- due to reduced neck mobility, Difficult Airway- due to limited oral opening and Difficult Airway- due to anterior larynx Future Recommendations: Recommend- induction with short-acting agent, and alternative techniques readily available  Do you want  her to have an OV or direct at [facility]?  Please advise- Thanks so much for your time,Marie

## 2020-04-20 ENCOUNTER — Other Ambulatory Visit: Payer: Self-pay

## 2020-04-20 DIAGNOSIS — Z86010 Personal history of colonic polyps: Secondary | ICD-10-CM

## 2020-04-20 NOTE — Telephone Encounter (Signed)
Attempted to call patient to inform her of the changes and why- no answer, mail box is full and unable to LM at this time- Brandi Warren

## 2020-04-20 NOTE — Telephone Encounter (Signed)
Next date I have is 06/09/20 7:30.  Arrival will be 6:00 [facility].  She will need COVID screen on 06/05/20 9:45 at the [place] location.

## 2020-04-20 NOTE — Telephone Encounter (Signed)
Pt returned call- informed her of 5-24 [facility] colon- 5-5 [facility] coln canceled= will go over instructions at [facility]

## 2020-05-07 ENCOUNTER — Ambulatory Visit (AMBULATORY_SURGERY_CENTER): Payer: Self-pay | Admitting: *Deleted

## 2020-05-07 ENCOUNTER — Other Ambulatory Visit: Payer: Self-pay

## 2020-05-07 VITALS — Ht 60.0 in | Wt 118.0 lb

## 2020-05-07 DIAGNOSIS — Z86010 Personal history of colonic polyps: Secondary | ICD-10-CM

## 2020-05-07 DIAGNOSIS — Z01818 Encounter for other preprocedural examination: Secondary | ICD-10-CM

## 2020-05-07 MED ORDER — NA SULFATE-K SULFATE-MG SULF 17.5-3.13-1.6 GM/177ML PO SOLN: ORAL | 0 refills | Status: DC

## 2020-05-07 NOTE — Progress Notes (Signed)
Patient is here in-person for PV. Patient denies any allergies to eggs or soy. Patient denies any problems with anesthesia/sedation. Patient denies any oxygen use at home. Patient denies taking any diet/weight loss medications or blood thinners. Patient is not being treated for MRSA or C-diff. Patient is aware of our care-partner policy and VPCHE-03 safety protocol.   Patient is fully COVID-19 vaccinated, per patient.

## 2020-05-21 ENCOUNTER — Encounter: Payer: Self-pay | Admitting: Gastroenterology

## 2020-06-04 ENCOUNTER — Other Ambulatory Visit (HOSPITAL_COMMUNITY): Payer: Self-pay

## 2020-06-05 ENCOUNTER — Other Ambulatory Visit (HOSPITAL_COMMUNITY): Payer: Self-pay

## 2020-06-05 ENCOUNTER — Telehealth: Payer: Self-pay | Admitting: *Deleted

## 2020-06-05 NOTE — Telephone Encounter (Signed)
Returned the patient's call and scheduled a follow up appt for 6/3

## 2020-06-08 ENCOUNTER — Encounter (HOSPITAL_COMMUNITY): Payer: Self-pay | Admitting: Certified Registered Nurse Anesthetist

## 2020-06-08 ENCOUNTER — Encounter: Payer: Self-pay | Admitting: Oncology

## 2020-06-09 ENCOUNTER — Encounter (HOSPITAL_COMMUNITY): Admission: RE | Payer: Self-pay | Source: Ambulatory Visit

## 2020-06-09 ENCOUNTER — Ambulatory Visit (HOSPITAL_COMMUNITY)
Admission: RE | Admit: 2020-06-09 | Payer: BC Managed Care – PPO | Source: Ambulatory Visit | Admitting: Gastroenterology

## 2020-06-09 SURGERY — COLONOSCOPY WITH PROPOFOL
Anesthesia: Monitor Anesthesia Care

## 2020-06-09 MED ORDER — PROPOFOL 10 MG/ML IV BOLUS
INTRAVENOUS | Status: AC
  Filled 2020-06-09: qty 20

## 2020-06-09 MED ORDER — PROPOFOL 1000 MG/100ML IV EMUL
INTRAVENOUS | Status: AC
  Filled 2020-06-09: qty 100

## 2020-06-09 NOTE — Anesthesia Preprocedure Evaluation (Deleted)
Anesthesia Evaluation  Patient identified by MRN, date of birth, ID band Patient awake    Reviewed: Allergy & Precautions, NPO status , Patient's Chart, lab work & pertinent test results  Airway Mallampati: II  TM Distance: >3 FB Neck ROM: Full    Dental no notable dental hx.    Pulmonary neg pulmonary ROS, former smoker,    Pulmonary exam normal breath sounds clear to auscultation       Cardiovascular negative cardio ROS Normal cardiovascular exam Rhythm:Regular Rate:Normal     Neuro/Psych Seizures -, Well Controlled,  negative psych ROS   GI/Hepatic negative GI ROS, Neg liver ROS,   Endo/Other  negative endocrine ROS  Renal/GU negative Renal ROS  negative genitourinary   Musculoskeletal  (+) Arthritis ,   Abdominal   Peds negative pediatric ROS (+)  Hematology negative hematology ROS (+)   Anesthesia Other Findings   Reproductive/Obstetrics negative OB ROS                             Anesthesia Physical Anesthesia Plan  ASA: II  Anesthesia Plan: MAC   Post-op Pain Management:    Induction: Intravenous  PONV Risk Score and Plan: 2 and Propofol infusion and Treatment may vary due to age or medical condition  Airway Management Planned: Simple Face Mask  Additional Equipment:   Intra-op Plan:   Post-operative Plan:   Informed Consent: I have reviewed the patients History and Physical, chart, labs and discussed the procedure including the risks, benefits and alternatives for the proposed anesthesia with the patient or authorized representative who has indicated his/her understanding and acceptance.     Dental advisory given  Plan Discussed with: CRNA and Surgeon  Anesthesia Plan Comments:         Anesthesia Quick Evaluation

## 2020-06-19 ENCOUNTER — Ambulatory Visit: Payer: BLUE CROSS/BLUE SHIELD | Admitting: Gynecologic Oncology

## 2020-07-09 ENCOUNTER — Telehealth: Payer: Self-pay | Admitting: Nurse Practitioner

## 2020-07-09 NOTE — Telephone Encounter (Signed)
Inbound call from patient wanting to schedule their procedure that was canceled in May.  Patient was scheduled to have colonoscopy at the hospital but patient has questions about that.  Please advise.

## 2020-07-09 NOTE — Telephone Encounter (Addendum)
Patient wanting to reschedule her screening colonoscopy.  She had been rescheduled to the hospital earlier this year. Cancelled due to having otitis media, fever and on antibiotics.  She questions the decision to schedule her at the [facility]. She points out the "claimed difficulty intubation" occurred in 2015. She had her colonoscopy in the [place] in 2016.  She is adamant she does not want to go to the hospital for this procedure. She has asked this be reconsidered.

## 2020-07-13 NOTE — Telephone Encounter (Signed)
Left a message to call back and ask for Beth.

## 2020-07-16 NOTE — Telephone Encounter (Signed)
Called to speak with the patient.  Mailbox is full and cannot accept messages.

## 2020-07-17 ENCOUNTER — Other Ambulatory Visit: Payer: Self-pay

## 2020-07-17 DIAGNOSIS — Z1211 Encounter for screening for malignant neoplasm of colon: Secondary | ICD-10-CM

## 2020-07-17 NOTE — Telephone Encounter (Signed)
Case and orders entered for 08/18/20.

## 2020-07-17 NOTE — Telephone Encounter (Signed)
Spoke with the patient and explained the reasoning behind not bringing her to the [facility] for her procedures. Patient states she understands. She will wait for the date for a hospital procedure.

## 2020-07-21 NOTE — Telephone Encounter (Signed)
Called the daughter. She agrees to send her mother a text that I am trying to reach her.  Pre-visit 08/11/20 at2:30 pm Colonoscopy at [facility] 08/18/20 arrive at 7:30 am for 9:00 am case.

## 2020-07-21 NOTE — Telephone Encounter (Signed)
Called the patient to advised of the 08/18/20 colonoscopy at [facility]. Arriving at 7:30 am. The mailbox is full and cannot accept any messages.

## 2020-07-22 ENCOUNTER — Other Ambulatory Visit: Payer: Self-pay

## 2020-07-22 NOTE — Telephone Encounter (Signed)
Letter mailed to the patient with appointment information.

## 2020-07-29 ENCOUNTER — Telehealth: Payer: Self-pay | Admitting: *Deleted

## 2020-07-29 NOTE — Telephone Encounter (Signed)
Patient called and left a message stating "I can't remember when my appt is, can you call and leave it on my voicemail." Attempted to reach the patient with no answer and voicemail is full.

## 2020-08-05 ENCOUNTER — Inpatient Hospital Stay: Payer: BC Managed Care – PPO | Attending: Gynecologic Oncology | Admitting: Gynecologic Oncology

## 2020-08-05 ENCOUNTER — Encounter: Payer: Self-pay | Admitting: Gynecologic Oncology

## 2020-08-05 ENCOUNTER — Inpatient Hospital Stay: Payer: BC Managed Care – PPO

## 2020-08-05 ENCOUNTER — Other Ambulatory Visit: Payer: Self-pay

## 2020-08-05 VITALS — BP 113/69 | HR 82 | Temp 98.5°F | Resp 18 | Ht 60.0 in | Wt 121.0 lb

## 2020-08-05 DIAGNOSIS — Z90722 Acquired absence of ovaries, bilateral: Secondary | ICD-10-CM | POA: Diagnosis not present

## 2020-08-05 DIAGNOSIS — R011 Cardiac murmur, unspecified: Secondary | ICD-10-CM | POA: Insufficient documentation

## 2020-08-05 DIAGNOSIS — Z8542 Personal history of malignant neoplasm of other parts of uterus: Secondary | ICD-10-CM | POA: Insufficient documentation

## 2020-08-05 DIAGNOSIS — R300 Dysuria: Secondary | ICD-10-CM

## 2020-08-05 DIAGNOSIS — G40909 Epilepsy, unspecified, not intractable, without status epilepticus: Secondary | ICD-10-CM | POA: Diagnosis not present

## 2020-08-05 DIAGNOSIS — Z90710 Acquired absence of both cervix and uterus: Secondary | ICD-10-CM | POA: Diagnosis not present

## 2020-08-05 DIAGNOSIS — Z79899 Other long term (current) drug therapy: Secondary | ICD-10-CM | POA: Insufficient documentation

## 2020-08-05 DIAGNOSIS — Z9221 Personal history of antineoplastic chemotherapy: Secondary | ICD-10-CM | POA: Diagnosis not present

## 2020-08-05 DIAGNOSIS — C541 Malignant neoplasm of endometrium: Secondary | ICD-10-CM

## 2020-08-05 DIAGNOSIS — R5383 Other fatigue: Secondary | ICD-10-CM | POA: Diagnosis not present

## 2020-08-05 NOTE — Patient Instructions (Signed)
Dr Denman George is going to test your urine and will call you with the results.  This visit is your final cancer surveillance visit. You will be able to follow-up with your regular gynecologist or primary doctor on an as needed basis.
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
## 2020-08-07 ENCOUNTER — Encounter: Payer: Self-pay | Admitting: Oncology

## 2020-08-07 LAB — URINE CULTURE

## 2020-08-10 ENCOUNTER — Telehealth: Payer: Self-pay

## 2020-08-10 ENCOUNTER — Telehealth: Payer: Self-pay | Admitting: Oncology

## 2020-08-10 ENCOUNTER — Telehealth: Payer: Self-pay | Admitting: *Deleted

## 2020-08-10 NOTE — Telephone Encounter (Signed)
Called Brandi Warren with urine culture results which showed that the sample may have been contaminated during collection.  Advised that if her symptoms persist, that we can get another urinalysis.  She verbalized understanding and said she is feeling fine now.  She will call back if her symptoms return.

## 2020-08-10 NOTE — Telephone Encounter (Signed)
Received message from Brandi Warren stating she is having burning with urination and would like an antibiotic called into [facility] off [address].  Attempted to call patient, unable to leave voicemail. Will attempt to call again to setup a lab appointment for urinalysis.

## 2020-08-10 NOTE — Telephone Encounter (Signed)
I called the patient to go over prep instructions, no answer, "mail box full" I was unable to leave a message. Prep instructions mailed to pt. Patient canceled her PV for 7/26.

## 2020-08-11 ENCOUNTER — Encounter: Payer: Self-pay | Admitting: Oncology

## 2020-08-11 NOTE — Telephone Encounter (Signed)
Attempted to call patient to set up an appt to obtain UA. Her mailbox is full and I was unable to leave a message.

## 2020-08-17 ENCOUNTER — Encounter (HOSPITAL_COMMUNITY): Payer: Self-pay | Admitting: Anesthesiology

## 2020-08-17 NOTE — Anesthesia Preprocedure Evaluation (Deleted)
Anesthesia Evaluation    Reviewed: Allergy & Precautions, Patient's Chart, lab work & pertinent test results  Airway        Dental   Pulmonary former smoker,           Cardiovascular      Neuro/Psych Seizures -, Well Controlled,     GI/Hepatic Neg liver ROS, Hx  Of rectal polyp   Endo/Other    Renal/GU   Female GU complaint endometrial ca    Musculoskeletal   Abdominal   Peds  Hematology   Anesthesia Other Findings   Reproductive/Obstetrics                             Anesthesia Physical Anesthesia Plan  ASA: 3  Anesthesia Plan: MAC   Post-op Pain Management:    Induction:   PONV Risk Score and Plan: Treatment may vary due to age or medical condition  Airway Management Planned: Natural Airway  Additional Equipment: None  Intra-op Plan:   Post-operative Plan: Extubation in OR  Informed Consent:   Plan Discussed with:   Anesthesia Plan Comments: (Screening colonoscopy)        Anesthesia Quick Evaluation

## 2020-08-18 ENCOUNTER — Ambulatory Visit (HOSPITAL_COMMUNITY)
Admission: RE | Admit: 2020-08-18 | Payer: BC Managed Care – PPO | Source: Home / Self Care | Admitting: Gastroenterology

## 2020-08-18 SURGERY — COLONOSCOPY WITH PROPOFOL
Anesthesia: Monitor Anesthesia Care

## 2020-08-18 MED ORDER — PROPOFOL 10 MG/ML IV BOLUS
INTRAVENOUS | Status: AC
  Filled 2020-08-18: qty 20

## 2020-08-18 MED ORDER — PROPOFOL 500 MG/50ML IV EMUL
INTRAVENOUS | Status: AC
  Filled 2020-08-18: qty 50

## 2020-08-18 NOTE — Telephone Encounter (Signed)
Please have her schedule an office appt with me or PG to reevaluate before rescheduling her colonoscopy.

## 2020-08-18 NOTE — Telephone Encounter (Signed)
Noted  

## 2020-08-18 NOTE — Telephone Encounter (Signed)
Same day cancellation in June as well.

## 2020-08-18 NOTE — Telephone Encounter (Signed)
Inbound call from patient requesting to cancel their procedure for today stating they are not feeling well.  Will call back to reschedule.

## 2020-09-14 ENCOUNTER — Telehealth: Payer: Self-pay | Admitting: Gastroenterology

## 2020-09-14 NOTE — Telephone Encounter (Signed)
Dr Fuller Plan said she has to come in to see him before she can be scheduled again. There is an opening at 3 pm tomorrow if she can come in.  Called the patient back. No answer. Left her a message to return my call. No details left in that message.

## 2020-09-14 NOTE — Telephone Encounter (Signed)
Inbound call from patient. Would like a call back to reschedule her colonoscopy procedure at [facility]. Best times to call around 11:30 or around 1. States she does not want super early in the morning.

## 2020-09-15 ENCOUNTER — Ambulatory Visit: Payer: BC Managed Care – PPO | Admitting: Gastroenterology

## 2020-09-15 NOTE — Telephone Encounter (Signed)
OK. This is her chance for a direct schedule colonoscopy. She has an office appt today at 3:00 so that can be cancelled without a late cancellation charge.

## 2020-09-15 NOTE — Telephone Encounter (Signed)
Spoke with the patient.  She is due her colonoscopy for hx of polyps. She has cancelled previously scheduled appointments for various reasons. Her procedure must be at the hospital due to her remote history of a difficult intubation in 2015. She protests this but accepts that we will only perform the procedure in the safest setting for her which will be the hospital.  Patient has been asked to schedule an appointment prior to being rescheduled for the hospital colonoscopy. She protests this as well. She states she has a difficult situation between caring for an elderly parent, an employer that is reluctant to give her time off and trying to balance in her own health care needs. She tells me the last cancellation was due to [place]. The patient is asking for a last chance and to be directly scheduled for her colonoscopy at the hospital, bypassing a return for an office visit. May I do this?

## 2020-09-16 ENCOUNTER — Other Ambulatory Visit: Payer: Self-pay

## 2020-09-16 DIAGNOSIS — Z1211 Encounter for screening for malignant neoplasm of colon: Secondary | ICD-10-CM

## 2020-09-16 NOTE — Telephone Encounter (Signed)
Patient accepts the procedure date as scheduled. She declines the nurse visit. She states she is completely comfortable with her instructions and already has her prep medication. Encouraged to review and call with any questions.

## 2020-09-16 NOTE — Telephone Encounter (Signed)
Colonoscopy 10/19/20 at 11:30 am Brandi Warren at 10:15 am unless hospital moves her time. Telephone nurse visit 10/08/20 at 3:30 am  Called the patient and asked she call back to get the dates and time.

## 2020-09-16 NOTE — Telephone Encounter (Signed)
Inbound call from patient returning call. She states she is okay with the times. She would also like a call back because she have some questions.

## 2020-10-08 ENCOUNTER — Encounter (HOSPITAL_COMMUNITY): Payer: Self-pay | Admitting: Gastroenterology

## 2020-10-08 NOTE — Progress Notes (Signed)
Attempted to obtain medical history via telephone, unable to reach at this time. I left a voicemail to return pre surgical testing department's phone call.  

## 2020-10-19 ENCOUNTER — Ambulatory Visit (HOSPITAL_COMMUNITY)
Admission: RE | Admit: 2020-10-19 | Discharge: 2020-10-19 | Disposition: A | Discharge status: Home / Self Care | Payer: BC Managed Care – PPO | Source: Ambulatory Visit | Attending: Gastroenterology | Admitting: Gastroenterology

## 2020-10-19 ENCOUNTER — Ambulatory Visit (HOSPITAL_COMMUNITY): Payer: BC Managed Care – PPO | Admitting: Anesthesiology

## 2020-10-19 ENCOUNTER — Encounter (HOSPITAL_COMMUNITY)
Admission: RE | Disposition: A | Discharge status: Home / Self Care | Payer: Self-pay | Source: Ambulatory Visit | Attending: Gastroenterology

## 2020-10-19 ENCOUNTER — Encounter (HOSPITAL_COMMUNITY): Payer: Self-pay | Admitting: Gastroenterology

## 2020-10-19 DIAGNOSIS — Z87891 Personal history of nicotine dependence: Secondary | ICD-10-CM | POA: Insufficient documentation

## 2020-10-19 DIAGNOSIS — Z8542 Personal history of malignant neoplasm of other parts of uterus: Secondary | ICD-10-CM | POA: Diagnosis not present

## 2020-10-19 DIAGNOSIS — Z882 Allergy status to sulfonamides status: Secondary | ICD-10-CM | POA: Insufficient documentation

## 2020-10-19 DIAGNOSIS — Z86010 Personal history of colonic polyps: Secondary | ICD-10-CM

## 2020-10-19 DIAGNOSIS — Z860101 Personal history of adenomatous and serrated colon polyps: Secondary | ICD-10-CM

## 2020-10-19 DIAGNOSIS — D123 Benign neoplasm of transverse colon: Secondary | ICD-10-CM | POA: Diagnosis not present

## 2020-10-19 DIAGNOSIS — Z1211 Encounter for screening for malignant neoplasm of colon: Secondary | ICD-10-CM | POA: Diagnosis not present

## 2020-10-19 HISTORY — PX: POLYPECTOMY: SHX5525

## 2020-10-19 HISTORY — PX: COLONOSCOPY WITH PROPOFOL: SHX5780

## 2020-10-19 SURGERY — COLONOSCOPY WITH PROPOFOL
Anesthesia: Monitor Anesthesia Care

## 2020-10-19 MED ORDER — PROPOFOL 500 MG/50ML IV EMUL
INTRAVENOUS | Status: DC | PRN
  Administered 2020-10-19: 150 ug/kg/min via INTRAVENOUS

## 2020-10-19 MED ORDER — LACTATED RINGERS IV SOLN: INTRAVENOUS | Status: DC | PRN

## 2020-10-19 MED ORDER — PROPOFOL 10 MG/ML IV BOLUS
INTRAVENOUS | Status: DC | PRN
  Administered 2020-10-19: 20 mg via INTRAVENOUS
  Administered 2020-10-19 (×2): 30 mg via INTRAVENOUS
  Administered 2020-10-19: 20 mg via INTRAVENOUS

## 2020-10-19 SURGICAL SUPPLY — 22 items

## 2020-10-19 NOTE — Anesthesia Procedure Notes (Signed)
Procedure Name: MAC Date/Time: 10/19/2020 12:53 PM Performed by: Lieutenant Diego, CRNA Pre-anesthesia Checklist: Patient identified, Emergency Drugs available, Suction available, Patient being monitored and Timeout performed Patient Re-evaluated:Patient Re-evaluated prior to induction Oxygen Delivery Method: Simple face mask Preoxygenation: Pre-oxygenation with 100% oxygen Induction Type: IV induction

## 2020-10-19 NOTE — Op Note (Signed)
[facility] Patient Name: Brandi Warren Procedure Date: 10/19/2020 MRN: [id number] Attending MD: Ladene Artist , MD Date of Birth: [birth year] CSN: [id number] Age: 60 Admit Type: Outpatient Procedure:                Colonoscopy Indications:              Surveillance: Personal history of adenomatous                            polyps on last colonoscopy > 5 years ago Providers:                Pricilla Riffle. Fuller Plan, MD, Truddie Coco, RN, Carmie End, RN Referring MD:             Stephani Police. Stann Mainland, NP Medicines:                Monitored Anesthesia Care Complications:            No immediate complications. Estimated blood loss:                            None. Estimated Blood Loss:     Estimated blood loss: none. Procedure:                Pre-Anesthesia Assessment:                           - Prior to the procedure, a History and Physical                            was performed, and patient medications and                            allergies were reviewed. The patient's tolerance of                            previous anesthesia was also reviewed. The risks                            and benefits of the procedure and the sedation                            options and risks were discussed with the patient.                            All questions were answered, and informed consent                            was obtained. Prior Anticoagulants: The patient has                            taken no previous anticoagulant or antiplatelet  agents. ASA Grade Assessment: II - A patient with                            mild systemic disease. After reviewing the risks                            and benefits, the patient was deemed in                            satisfactory condition to undergo the procedure.                           After obtaining informed consent, the colonoscope                            was passed under  direct vision. Throughout the                            procedure, the patient's blood pressure, pulse, and                            oxygen saturations were monitored continuously. The                            PCF-HQ190L ([id number] Olympus colonoscope was                            introduced through the anus and advanced to the the                            cecum, identified by appendiceal orifice and                            ileocecal valve. The ileocecal valve, appendiceal                            orifice, and rectum were photographed. The quality                            of the bowel preparation was excellent. The                            colonoscopy was performed without difficulty. The                            patient tolerated the procedure well. Scope In: 12:49:01 PM Scope Out: 1:05:50 PM Scope Withdrawal Time: 0 hours 13 minutes 31 seconds  Total Procedure Duration: 0 hours 16 minutes 49 seconds  Findings:      The perianal and digital rectal examinations were normal.      A 6 mm polyp was found in the hepatic flexure. The polyp was sessile.       The polyp was removed with a cold snare. Resection and retrieval were       complete.  The exam was otherwise without abnormality on direct and retroflexion       views. Impression:               - One 6 mm polyp at the hepatic flexure, removed                            with a cold snare. Resected and retrieved.                           - The examination was otherwise normal on direct                            and retroflexion views. Moderate Sedation:      Not Applicable - Patient had care per Anesthesia. Recommendation:           - Repeat colonoscopy after studies are complete for                            surveillance based on pathology results.                           - Patient has a contact number available for                            emergencies. The signs and symptoms of potential                             delayed complications were discussed with the                            patient. Return to normal activities tomorrow.                            Written discharge instructions were provided to the                            patient.                           - Resume previous diet.                           - Continue present medications.                           - Await pathology results. Procedure Code(s):        --- Professional ---                           [phone], Colonoscopy, flexible; with removal of                            tumor(s), polyp(s), or other lesion(s) by snare                            technique Diagnosis  Code(s):        --- Professional ---                           Z86.010, Personal history of colonic polyps                           K63.5, Polyp of colon CPT copyright 2019 [employer] Association. All rights reserved. The codes documented in this report are preliminary and upon coder review may  be revised to meet current compliance requirements. Ladene Artist, MD 10/19/2020 1:15:54 PM This report has been signed electronically. Number of Addenda: 0

## 2020-10-19 NOTE — Progress Notes (Signed)
To RR s/p colonoscopy. EKG rhythm noted to be a sinus arrhythmia with occasional inverted p wave. In chart, last EKG strips from 2013 and 2015 with similar appearance, sinus arrhythmia and occas inverted p waves. Pt states she was told "years ago" she had a murmur but no treatment necessary. Reports feeling well, denies ever having any problems with dizziness or palpations. Dr Fuller Plan at bedside, reviewed prior and current ekg rhythms, recommended patient follow up with outpt cardiology. Strip printed and added to chart.

## 2020-10-19 NOTE — Anesthesia Postprocedure Evaluation (Signed)
Anesthesia Post Note  Patient: Brandi Warren  Procedure(s) Performed: COLONOSCOPY WITH PROPOFOL POLYPECTOMY     Patient location during evaluation: PACU Anesthesia Type: MAC Level of consciousness: awake and alert Pain management: pain level controlled Vital Signs Assessment: post-procedure vital signs reviewed and stable Respiratory status: spontaneous breathing and respiratory function stable Cardiovascular status: stable Postop Assessment: no apparent nausea or vomiting Anesthetic complications: no   No notable events documented.  Last Vitals:  Vitals:   10/19/20 1330 10/19/20 1340  BP: (!) 101/53 (!) 110/54  Pulse: 71 65  Resp: (!) 21 (!) 21  Temp:    SpO2: 100% 100%    Last Pain:  Vitals:   10/19/20 1340  TempSrc:   PainSc: 0-No pain                 Merlinda Frederick

## 2020-10-19 NOTE — Anesthesia Preprocedure Evaluation (Addendum)
Anesthesia Evaluation  Patient identified by MRN, date of birth, ID band Patient awake    Reviewed: Allergy & Precautions, Patient's Chart, lab work & pertinent test results  Airway Mallampati: II  TM Distance: >3 FB Neck ROM: Full    Dental no notable dental hx.    Pulmonary neg pulmonary ROS, former smoker,    Pulmonary exam normal breath sounds clear to auscultation       Cardiovascular negative cardio ROS Normal cardiovascular exam Rhythm:Regular Rate:Normal     Neuro/Psych Seizures -, Well Controlled,     GI/Hepatic negative GI ROS, Neg liver ROS,   Endo/Other  negative endocrine ROS  Renal/GU negative Renal ROS     Musculoskeletal negative musculoskeletal ROS (+)   Abdominal   Peds  Hematology negative hematology ROS (+)   Anesthesia Other Findings   Reproductive/Obstetrics                            Anesthesia Physical Anesthesia Plan  ASA: 2  Anesthesia Plan: MAC   Post-op Pain Management:    Induction:   PONV Risk Score and Plan: 2 and Ondansetron and Propofol infusion  Airway Management Planned: Natural Airway  Additional Equipment:   Intra-op Plan:   Post-operative Plan:   Informed Consent:   Plan Discussed with: Anesthesiologist  Anesthesia Plan Comments:         Anesthesia Quick Evaluation
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
## 2020-10-19 NOTE — Transfer of Care (Signed)
Immediate Anesthesia Transfer of Care Note  Patient: Brandi Warren  Procedure(s) Performed: COLONOSCOPY WITH PROPOFOL POLYPECTOMY  Patient Location: Endoscopy Unit  Anesthesia Type:MAC  Level of Consciousness: awake  Airway & Oxygen Therapy: Patient Spontanous Breathing and Patient connected to face mask oxygen  Post-op Assessment: Report given to RN and Post -op Vital signs reviewed and stable  Post vital signs: Reviewed and stable  Last Vitals:  Vitals Value Taken Time  BP    Temp    Pulse    Resp    SpO2      Last Pain:  Vitals:   10/19/20 1055  TempSrc: Oral  PainSc: 0-No pain         Complications: No notable events documented.

## 2020-10-19 NOTE — Discharge Instructions (Signed)

## 2020-10-20 LAB — SURGICAL PATHOLOGY

## 2020-10-21 ENCOUNTER — Encounter: Payer: Self-pay | Admitting: Gastroenterology

## 2021-09-09 ENCOUNTER — Encounter: Payer: Self-pay | Admitting: Oncology

## 2021-09-16 ENCOUNTER — Ambulatory Visit: Payer: BC Managed Care – PPO | Admitting: Nurse Practitioner

## 2021-10-26 ENCOUNTER — Ambulatory Visit: Payer: Self-pay | Admitting: Nurse Practitioner

## 2022-12-21 ENCOUNTER — Encounter: Payer: Self-pay | Admitting: Pediatrics
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
## 2023-02-10 ENCOUNTER — Other Ambulatory Visit (INDEPENDENT_AMBULATORY_CARE_PROVIDER_SITE_OTHER): Payer: 59

## 2023-02-10 ENCOUNTER — Encounter: Payer: Self-pay | Admitting: Pediatrics

## 2023-02-10 ENCOUNTER — Ambulatory Visit (INDEPENDENT_AMBULATORY_CARE_PROVIDER_SITE_OTHER): Payer: 59 | Admitting: Pediatrics

## 2023-02-10 ENCOUNTER — Encounter: Payer: Self-pay | Admitting: Oncology

## 2023-02-10 VITALS — BP 108/70 | HR 80 | Ht 65.0 in | Wt 134.1 lb

## 2023-02-10 DIAGNOSIS — K589 Irritable bowel syndrome without diarrhea: Secondary | ICD-10-CM

## 2023-02-10 DIAGNOSIS — R1032 Left lower quadrant pain: Secondary | ICD-10-CM | POA: Diagnosis not present

## 2023-02-10 DIAGNOSIS — K5730 Diverticulosis of large intestine without perforation or abscess without bleeding: Secondary | ICD-10-CM | POA: Diagnosis not present

## 2023-02-10 DIAGNOSIS — R197 Diarrhea, unspecified: Secondary | ICD-10-CM

## 2023-02-10 DIAGNOSIS — Z860101 Personal history of adenomatous and serrated colon polyps: Secondary | ICD-10-CM

## 2023-02-10 LAB — FOLATE: Folate: 6.5 ng/mL (ref >5.9)

## 2023-02-10 LAB — TSH: TSH: 2.41 u[IU]/mL (ref 0.35–5.50)

## 2023-02-10 LAB — VITAMIN B12: Vitamin B-12: 170 pg/mL — ABNORMAL LOW (ref 211–911)

## 2023-02-10 NOTE — Patient Instructions (Addendum)
Your provider has requested that you go to the basement level for lab work before leaving today. Press "B" on the elevator. The lab is located at the first door on the left as you exit the elevator.  _______________________________________________________  If your blood pressure at your visit was 140/90 or greater, please contact your primary care physician to follow up on this.  _______________________________________________________  If you are age 61 or older, your body mass index should be between 23-30. Your Body mass index is 22.32 kg/m. If this is out of the aforementioned range listed, please consider follow up with your Primary Care Provider.  If you are age 45 or younger, your body mass index should be between 19-25. Your Body mass index is 22.32 kg/m. If this is out of the aformentioned range listed, please consider follow up with your Primary Care Provider.   ________________________________________________________  The [facility] providers would like to encourage you to use [facility] to communicate with providers for non-urgent requests or questions.  Due to long hold times on the telephone, sending your provider a message by [facility] may be a faster and more efficient way to get a response.  Please allow 48 business hours for a response.  Please remember that this is for non-urgent requests.  _______________________________________________________  Due to recent changes in healthcare laws, you may see the results of your imaging and laboratory studies on MyChart before your provider has had a chance to review them.  We understand that in some cases there may be results that are confusing or concerning to you. Not all laboratory results come back in the same time frame and the provider may be waiting for multiple results in order to interpret others.  Please give [place] 48 hours in order for your provider to thoroughly review all the results before contacting the office for clarification of  your results.   Thank you for entrusting me with your care and choosing [facility].  Dr Doy Hutching

## 2023-02-11 LAB — TISSUE TRANSGLUTAMINASE, IGA: (tTG) Ab, IgA: <1 U/mL

## 2023-02-11 LAB — IGA: Immunoglobulin A: 202 mg/dL (ref 47–310)

## 2023-02-15 ENCOUNTER — Encounter: Payer: Self-pay | Admitting: Pediatrics

## 2023-02-16 NOTE — Telephone Encounter (Signed)
Called patient back and we discussed b12 injections vs oral supplementation. Again recommended IM injections, however, patient prefers oral supplementation instead. She will purchase oral B12 and take daily.

## 2023-10-05 ENCOUNTER — Telehealth: Payer: Self-pay | Admitting: Pediatrics

## 2023-10-05 DIAGNOSIS — R194 Change in bowel habit: Secondary | ICD-10-CM

## 2023-10-05 DIAGNOSIS — R1084 Generalized abdominal pain: Secondary | ICD-10-CM

## 2023-10-05 NOTE — Telephone Encounter (Signed)
 Inbound call from patient stating that she has been having abdominal cramp as well as change in her bowel habits. She states that sometimes she has hard long stools and sometimes its watery. She is requested a call to discuss if she can have a CT scan to get to the root cause of her symptoms. Patient was also scheduled to see Alan on 10/14 at 10:20. Requesting a call back to discuss. Please advise.

## 2023-10-05 NOTE — Telephone Encounter (Signed)
 Patient returned call. Patient reports that she has continued to have the same symptoms since her last OV but recently worsening. Patient reports intermittent loose stools alternating with hard stools.  Patient is not sure if she is constipated. Patient reports abdominal cramping really bad in left side, all of the way around to her back. Patient wondered if she had a kidney stone the pain was so bad. Reports some fatigue and some nausea. No blood or mucous in the stool. Reports rectum is aggravated due to bowel movements and wiping. Patient is not taking any OTC medications for symptoms or fiber. Patient wonders if she needs imaging done to see what is causing her symptoms. Patient has an appt in October but wants to know if she needs to come in sooner. Please advise, thanks.

## 2023-10-05 NOTE — Telephone Encounter (Signed)
 Patient returning call. Please advise. Thank you.

## 2023-10-05 NOTE — Telephone Encounter (Signed)
 Lm on vm for patient to return call

## 2023-10-06 MED ORDER — HYOSCYAMINE SULFATE 0.125 MG SL SUBL: 0.1250 mg | SUBLINGUAL_TABLET | Freq: Four times a day (QID) | SUBLINGUAL | 1 refills | Status: AC | PRN

## 2023-10-06 NOTE — Telephone Encounter (Signed)
 In bound call from pt returning a call back to [place]. Patient is requesting a call back. Please advise.

## 2023-10-06 NOTE — Addendum Note (Signed)
 Addended by: Lona Six N on: 10/06/2023 08:27 AM   Modules accepted: Orders

## 2023-10-06 NOTE — Telephone Encounter (Signed)
 Attempted to reach patient several times. Her phone is going straight to vm. Lm for patient to return call.

## 2023-10-06 NOTE — Telephone Encounter (Signed)
 Lm on vm for patient to return call.   CT order in epic.   Hyoscyamine  RX sent to pharmacy. Tablet was not covered, sent in ODT instead.

## 2023-10-06 NOTE — Telephone Encounter (Signed)
 Inbound call from patient apologizing she keeps missing your call. Patient stated that she is currently on lunch and will be happy to take your call. Patient has question in regards to her medication and how she is currently feeling. Please advise.

## 2023-10-06 NOTE — Telephone Encounter (Signed)
 Inbound call form patient stating she would like to speak to [place] but had to run back to work, if she can get a call back after 1:05pm. Please advise  Thank you

## 2023-10-06 NOTE — Telephone Encounter (Signed)
 Called and spoke with patient regarding recommendations as outlined below. Patient knows to expect a call from radiology scheduling to set up her CT appt. Patient will begin fiber supplement and 1/2 capful of Miralax daily. Patient has been advised that RX for Hyoscyamine  was sent to her pharmacy. Patient is aware that this a PRN medication but if needed on a scheduled basis that is fine to do. Patient was thankful that I called her back. Patient will try recommendations, complete CT scan, and follow up in the office in October unless she needs to be seen sooner. Patient verbalized understanding and had no concerns at the end of the call.

## 2023-10-10 NOTE — Addendum Note (Signed)
 Addended by: MERCER CRISTINO SAILOR on: 10/10/2023 08:47 AM   Modules accepted: Orders

## 2023-10-10 NOTE — Telephone Encounter (Signed)
 Inbound call from patient stating that she was told that she needed to have lab work before her scheduled CT on 9/26. Requesting a callback before 9:00 due to having to work. Please advise.

## 2023-10-10 NOTE — Telephone Encounter (Signed)
 Called and spoke with patient. Patient was told that she needs lab work prior to CT scan. Advised that she would likely need kidney function checked since she will be receiving contrast. Advised that I will put in lab order for patient to have drawn at time of CT appt today. Patient verbalized understanding and had no concerns at the end of the call.

## 2023-10-13 ENCOUNTER — Ambulatory Visit (HOSPITAL_COMMUNITY)
Admission: RE | Admit: 2023-10-13 | Discharge: 2023-10-13 | Disposition: A | Discharge status: Home / Self Care | Source: Ambulatory Visit | Attending: Pediatrics | Admitting: Pediatrics

## 2023-10-13 DIAGNOSIS — R194 Change in bowel habit: Secondary | ICD-10-CM | POA: Insufficient documentation

## 2023-10-13 DIAGNOSIS — R1084 Generalized abdominal pain: Secondary | ICD-10-CM | POA: Diagnosis present

## 2023-10-13 LAB — POCT I-STAT CREATININE: Creatinine, Ser: 0.8 mg/dL (ref 0.44–1.00)

## 2023-10-13 MED ORDER — IOHEXOL 300 MG/ML  SOLN
100.0000 mL | Freq: Once | INTRAMUSCULAR | Status: AC | PRN
  Administered 2023-10-13: 100 mL via INTRAVENOUS

## 2023-10-15 ENCOUNTER — Encounter: Payer: Self-pay | Admitting: Pediatrics

## 2023-10-18 ENCOUNTER — Telehealth: Payer: Self-pay | Admitting: Pediatrics

## 2023-10-18 NOTE — Telephone Encounter (Signed)
 Received a call from patient requesting nurse fu call or mychart message regarding tool sample usage, she believes she is also experiencing c diff symptoms. Please review and advise  Thank you

## 2023-10-18 NOTE — Telephone Encounter (Signed)
 This is a duplicate message and is being addressed. See 10/15/23 patient message for details.

## 2023-10-19 ENCOUNTER — Ambulatory Visit: Payer: Self-pay | Admitting: Pediatrics

## 2023-10-20 ENCOUNTER — Telehealth: Payer: Self-pay | Admitting: Physician Assistant

## 2023-10-20 NOTE — Telephone Encounter (Signed)
 error

## 2023-10-20 NOTE — Telephone Encounter (Signed)
 Patient came in wanting another stool kit to collect hard stool because she feels like the sample she

## 2023-10-23 ENCOUNTER — Telehealth: Payer: Self-pay

## 2023-10-23 NOTE — Telephone Encounter (Signed)
 Lmtcb. (Re: when was the stool collected for the diatherix test?)

## 2023-10-24 NOTE — Telephone Encounter (Signed)
 Lmtcb

## 2023-10-24 NOTE — Telephone Encounter (Signed)
 A task is already open for the patient for this date request.

## 2023-10-30 ENCOUNTER — Encounter: Payer: Self-pay | Admitting: Pediatrics
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
## 2023-10-31 ENCOUNTER — Ambulatory Visit (INDEPENDENT_AMBULATORY_CARE_PROVIDER_SITE_OTHER)
Admission: RE | Admit: 2023-10-31 | Discharge: 2023-10-31 | Disposition: A | Discharge status: Home / Self Care | Source: Ambulatory Visit | Attending: Physician Assistant | Admitting: Physician Assistant

## 2023-10-31 ENCOUNTER — Encounter: Payer: Self-pay | Admitting: Physician Assistant

## 2023-10-31 ENCOUNTER — Other Ambulatory Visit (INDEPENDENT_AMBULATORY_CARE_PROVIDER_SITE_OTHER)

## 2023-10-31 ENCOUNTER — Ambulatory Visit (INDEPENDENT_AMBULATORY_CARE_PROVIDER_SITE_OTHER): Admitting: Physician Assistant

## 2023-10-31 VITALS — BP 132/80 | HR 79 | Ht 65.0 in | Wt 137.8 lb

## 2023-10-31 DIAGNOSIS — E538 Deficiency of other specified B group vitamins: Secondary | ICD-10-CM

## 2023-10-31 DIAGNOSIS — R1084 Generalized abdominal pain: Secondary | ICD-10-CM

## 2023-10-31 DIAGNOSIS — D123 Benign neoplasm of transverse colon: Secondary | ICD-10-CM

## 2023-10-31 DIAGNOSIS — K601 Chronic anal fissure: Secondary | ICD-10-CM

## 2023-10-31 DIAGNOSIS — K589 Irritable bowel syndrome without diarrhea: Secondary | ICD-10-CM | POA: Diagnosis not present

## 2023-10-31 DIAGNOSIS — R194 Change in bowel habit: Secondary | ICD-10-CM

## 2023-10-31 DIAGNOSIS — R197 Diarrhea, unspecified: Secondary | ICD-10-CM | POA: Diagnosis not present

## 2023-10-31 LAB — CBC WITH DIFFERENTIAL/PLATELET
Basophils Absolute: 0 K/uL (ref 0.0–0.1)
Basophils Relative: 0.5 % (ref 0.0–3.0)
Eosinophils Absolute: 0 K/uL (ref 0.0–0.7)
Eosinophils Relative: 0 % (ref 0.0–5.0)
HCT: 35 % — ABNORMAL LOW (ref 36.0–46.0)
Hemoglobin: 11.8 g/dL — ABNORMAL LOW (ref 12.0–15.0)
Lymphocytes Relative: 35.8 % (ref 12.0–46.0)
Lymphs Abs: 1.4 K/uL (ref 0.7–4.0)
MCHC: 33.7 g/dL (ref 30.0–36.0)
MCV: 92.1 fl (ref 78.0–100.0)
Monocytes Absolute: 0.3 K/uL (ref 0.1–1.0)
Monocytes Relative: 6.5 % (ref 3.0–12.0)
Neutro Abs: 2.2 K/uL (ref 1.4–7.7)
Neutrophils Relative %: 57.2 % (ref 43.0–77.0)
Platelets: 184 K/uL (ref 150.0–400.0)
RBC: 3.8 Mil/uL — ABNORMAL LOW (ref 3.87–5.11)
RDW: 13.3 % (ref 11.5–15.5)
WBC: 3.8 K/uL — ABNORMAL LOW (ref 4.0–10.5)

## 2023-10-31 LAB — COMPREHENSIVE METABOLIC PANEL WITH GFR
ALT: 9 U/L (ref 0–35)
AST: 15 U/L (ref 0–37)
Albumin: 4.1 g/dL (ref 3.5–5.2)
Alkaline Phosphatase: 53 U/L (ref 39–117)
BUN: 14 mg/dL (ref 6–23)
CO2: 28 meq/L (ref 19–32)
Calcium: 8.7 mg/dL (ref 8.4–10.5)
Chloride: 104 meq/L (ref 96–112)
Creatinine, Ser: 0.72 mg/dL (ref 0.40–1.20)
GFR: 90.22 mL/min (ref >60.00)
Glucose, Bld: 95 mg/dL (ref 70–99)
Potassium: 4 meq/L (ref 3.5–5.1)
Sodium: 141 meq/L (ref 135–145)
Total Bilirubin: 0.4 mg/dL (ref 0.2–1.2)
Total Protein: 6.5 g/dL (ref 6.0–8.3)

## 2023-10-31 LAB — VITAMIN B12: Vitamin B-12: 561 pg/mL (ref 211–911)

## 2023-10-31 MED ORDER — METRONIDAZOLE 250 MG PO TABS: 250.0000 mg | ORAL_TABLET | Freq: Three times a day (TID) | ORAL | 0 refills | Status: AC

## 2023-10-31 NOTE — Addendum Note (Signed)
 Addended by: Valia Wingard K on: 10/31/2023 01:43 PM   Modules accepted: Orders

## 2023-10-31 NOTE — Patient Instructions (Addendum)
 Your provider has requested that you have an abdominal x ray before leaving today. Please go to the basement floor to our Radiology department for the test.  Your provider has requested that you go to the basement level for lab work before leaving today. Press B on the elevator. The lab is located at the first door on the left as you exit the elevator.  FIBER SUPPLEMENT You can do metamucil or fibercon once or twice a day but if this causes gas/bloating please switch to Benefiber or Citracel.  Fiber is good for constipation/diarrhea/irritable bowel syndrome.  It can also help with weight loss and can help lower your bad cholesterol (LDL).  Please do 1 TBSP in the morning in water, coffee, or tea.  It can take up to a month before you can see a difference with your bowel movements.  It is cheapest from [employer], [employer], [employer].    Small intestinal bacterial overgrowth (SIBO) occurs when there is an abnormal increase in the overall bacterial population in the small intestine -- particularly types of bacteria not commonly found in that part of the digestive tract. Small intestinal bacterial overgrowth (SIBO) commonly results when a circumstance -- such as surgery or disease -- slows the passage of food and waste products in the digestive tract, creating a breeding ground for bacteria.  WILL TREAT FOR THIS WITH THE FLAGYL  DO NOT DRINK WITH THIS MEDICATION TAKE IT WITH FOOD   Anal Fissure, Adult  A fissure is a linear defect in the anal mucosa, symptoms include burning, itching, discomfort especially with a bowel movement with associated rectal bleeding.  Risk factors include low fiber diet, chronic constipation and straining. Anal fissures can take a very long time to heal so this will be a 2 to 36-month process.  Treatment for a fissure includes:  -decreasing time in the toilet should not be more than 5 minutes -adding fiber supplement such as Benefiber or Citrucel -increasing  water. -There is a lubricating suppository over-the-counter called Calmol-4 you can get from [place] or from your pharmacy (may have to order) that I want to do twice daily morning and evening for 4 weeks.  This is a 60 to 80% success rate.    Signs and symptoms of SIBO often include: Loss of appetite Abdominal pain Nausea Bloating An uncomfortable feeling of fullness after eating Diarrhea or constipation, depending on the type of gas produced  What foods trigger SIBO? While foods aren't the original cause of SIBO, certain foods do encourage the overgrowth of the wrong bacteria in your small intestine. If you're feeding them their favorite foods, they're going to grow more, and that will trigger more of your SIBO symptoms. By the same token, you can help reduce the overgrowth by starving the problematic bacteria of their favorite foods. This strategy has led to a number of proposed SIBO eating plans. The plans vary, and so do individual results. But in general, they tend to recommend limiting carbohydrates.  These include: Sugars and sweeteners. Fruits and starchy vegetables. Dairy products. Grains.  There is a test for this we can do called a breath test, if you are positive we will treat you with an antibiotic to see if it helps.  Your symptoms are very suspicious for this condition, as discussed, we will start you on an antibiotic to see if this helps.   Here some information about pelvic floor dysfunction. This may be contributing to some of your symptoms. We will continue with our evaluation but  I do want you to consider adding on fiber supplement with low-dose MiraLAX daily. We could also refer to pelvic floor physical therapy.   Pelvic Floor Dysfunction, Female Pelvic floor dysfunction (PFD) is a condition that results when the group of muscles and connective tissues that support the organs in the pelvis (pelvic floor muscles) do not work well. These muscles and their  connections form a sling that supports the colon and bladder. In women, they also support the uterus. PFD causes pelvic floor muscles to be too weak, too tight, or both. In PFD, muscle movements are not coordinated. This may cause bowel or bladder problems. It may also cause pain. What are the causes? This condition may be caused by an injury to the pelvic area or by a weakening of pelvic muscles. This often results from pregnancy and childbirth or other types of strain. In many cases, the exact cause is not known. What increases the risk? The following factors may make you more likely to develop this condition: Having chronic bladder tissue inflammation (interstitial cystitis). Being an older person. Being overweight. History of radiation treatment for cancer in the pelvic region. Previous pelvic surgery, such as removal of the uterus (hysterectomy). What are the signs or symptoms? Symptoms of this condition vary and may include: Bladder symptoms, such as: Trouble starting urination and emptying the bladder. Frequent urinary tract infections. Leaking urine when coughing, laughing, or exercising (stress incontinence). Having to pass urine urgently or frequently. Pain when passing urine. Bowel symptoms, such as: Constipation. Urgent or frequent bowel movements. Incomplete bowel movements. Painful bowel movements. Leaking stool or gas. Unexplained genital or rectal pain. Genital or rectal muscle spasms. Low back pain. Other symptoms may include: A heavy, full, or aching feeling in the vagina. A bulge that protrudes into the vagina. Pain during or after sex. How is this diagnosed? This condition may be diagnosed based on: Your symptoms and medical history. A physical exam. During the exam, your health care provider may check your pelvic muscles for tightness, spasm, pain, or weakness. This may include a rectal exam and a pelvic exam. In some cases, you may have diagnostic tests, such  as: Electrical muscle function tests. Urine flow testing. X-ray tests of bowel function. Ultrasound of the pelvic organs. How is this treated? Treatment for this condition depends on the symptoms. Treatment options include: Physical therapy. This may include Kegel exercises to help relax or strengthen the pelvic floor muscles. Biofeedback. This type of therapy provides feedback on how tight your pelvic floor muscles are so that you can learn to control them. Internal or external massage therapy. A treatment that involves electrical stimulation of the pelvic floor muscles to help control pain (transcutaneous electrical nerve stimulation, or TENS). Sound wave therapy (ultrasound) to reduce muscle spasms. Medicines, such as: Muscle relaxants. Bladder control medicines. Surgery to reconstruct or support pelvic floor muscles may be an option if other treatments do not help. Follow these instructions at home: Activity Do your usual activities as told by your health care provider. Ask your health care provider if you should modify any activities. Do pelvic floor strengthening or relaxing exercises at home as told by your physical therapist. Lifestyle Maintain a healthy weight. Eat foods that are high in fiber, such as beans, whole grains, and fresh fruits and vegetables. Limit foods that are high in fat and processed sugars, such as fried or sweet foods. Manage stress with relaxation techniques such as yoga or meditation. General instructions If you have problems  with leakage: Use absorbable pads or wear padded underwear. Wash frequently with mild soap. Keep your genital and anal area as clean and dry as possible. Ask your health care provider if you should try a barrier cream to prevent skin irritation. Take warm baths to relieve pelvic muscle tension or spasms. Take over-the-counter and prescription medicines only as told by your health care provider. Keep all follow-up visits. How is  this prevented? The cause of PFD is not always known, but there are a few things you can do to reduce the risk of developing this condition, including: Staying at a healthy weight. Getting regular exercise. Managing stress. Contact a health care provider if: Your symptoms are not improving with home care. You have signs or symptoms of PFD that get worse at home. You develop new signs or symptoms. You have signs of a urinary tract infection, such as: Fever. Chills. Increased urinary frequency. A burning feeling when urinating. You have not had a bowel movement in 3 days (constipation). Summary Pelvic floor dysfunction results when the muscles and connective tissues in your pelvic floor do not work well. These muscles and their connections form a sling that supports your colon and bladder. In women, they also support the uterus. PFD may be caused by an injury to the pelvic area or by a weakening of pelvic muscles. PFD causes pelvic floor muscles to be too weak, too tight, or a combination of both. Symptoms may vary from person to person. In most cases, PFD can be treated with physical therapies and medicines. Surgery may be an option if other treatments do not help. This information is not intended to replace advice given to you by your health care provider. Make sure you discuss any questions you have with your health care provider. Document Revised: 05/13/2020 Document Reviewed: 05/13/2020 Elsevier Patient Education  2022 [employer]  What Is Microscopic Colitis? Microscopic colitis is a condition that causes chronic, watery diarrhea. Unlike other types of colitis (like ulcerative colitis or Crohn's disease), the colon (large intestine) appears normal during a colonoscopy. The inflammation can only be seen under a microscope--hence the name.  There are two main types:  Lymphocytic colitis - increased white blood cells (lymphocytes) in the colon lining Collagenous colitis - thickened  layer of collagen (a protein) in the colon lining  Common Symptoms  Ongoing watery diarrhea (often multiple times a day) Urgency to have a bowel movement Abdominal pain or cramping Bloating or gas Fatigue Weight loss (less common)  Causes and Risk Factors The exact cause isn't fully known, but possible contributing factors include:  Immune system reactions  Medications, such as: NSAIDs (e.g., ibuprofen ) Proton pump inhibitors (e.g., omeprazole) SSRIs (e.g., sertraline) Smoking Older age (most common in people over 66) Female sex (more common in women)   How Is It Diagnosed?  Colonoscopy: usually appears normal  Biopsy (tiny tissue sample from the colon) is needed to see inflammation under a microscope  Treatment Options Treatment depends on how severe your symptoms are:  Lifestyle & Diet Changes  Avoid trigger foods (e.g., caffeine, dairy, fatty foods) Quit smoking Reduce alcohol Stay hydrated  Medications  Anti-diarrheal meds (like loperamide/Imodium) Budesonide (a corticosteroid with fewer side effects, often the first choice for moderate-to-severe cases) Bismuth subsalicylate (e.g., Pepto-Bismol) Stopping or switching medications that may be triggering the condition   What's the Outlook?  Many people improve with treatment. The condition may come and go. It's not associated with colon cancer. Regular follow-up helps keep symptoms under  control.  I appreciate the  opportunity to care for you  Thank You   [facility]

## 2023-11-01 ENCOUNTER — Ambulatory Visit: Payer: Self-pay | Admitting: Physician Assistant

## 2023-11-01 ENCOUNTER — Other Ambulatory Visit: Payer: Self-pay

## 2023-11-01 DIAGNOSIS — D649 Anemia, unspecified: Secondary | ICD-10-CM

## 2023-11-03 LAB — INTRINSIC FACTOR BLOCKING ANTIBODY: Intrinsic Factor: NEGATIVE

## 2023-11-08 LAB — INTRINSIC FACTOR BLOCKING ANTIBODY: Intrinsic Factor: NEGATIVE

## 2023-11-08 LAB — ANTI-PARIETAL ANTIBODY: PARIETAL CELL AB SCREEN: NEGATIVE

## 2023-11-15 NOTE — Addendum Note (Signed)
 Addended by: MERCER CRISTINO SAILOR on: 11/15/2023 09:20 AM   Modules accepted: Orders
